# Patient Record
Sex: Female | Born: 1955 | Race: White | Hispanic: No | Smoking: Current every day smoker
Health system: Southern US, Community
[De-identification: ages and names within clinical notes are randomized; demographics above are authoritative.]

## PROBLEM LIST (undated history)

## (undated) DIAGNOSIS — I10 Essential (primary) hypertension: Secondary | ICD-10-CM

## (undated) DIAGNOSIS — G4700 Insomnia, unspecified: Secondary | ICD-10-CM

## (undated) DIAGNOSIS — F32A Depression, unspecified: Secondary | ICD-10-CM

## (undated) DIAGNOSIS — R918 Other nonspecific abnormal finding of lung field: Secondary | ICD-10-CM

## (undated) DIAGNOSIS — N3941 Urge incontinence: Secondary | ICD-10-CM

## (undated) DIAGNOSIS — E7800 Pure hypercholesterolemia, unspecified: Secondary | ICD-10-CM

## (undated) DIAGNOSIS — J849 Interstitial pulmonary disease, unspecified: Secondary | ICD-10-CM

## (undated) DIAGNOSIS — J449 Chronic obstructive pulmonary disease, unspecified: Secondary | ICD-10-CM

## (undated) DIAGNOSIS — J309 Allergic rhinitis, unspecified: Secondary | ICD-10-CM

## (undated) DIAGNOSIS — I7090 Unspecified atherosclerosis: Secondary | ICD-10-CM

## (undated) DIAGNOSIS — Z789 Other specified health status: Secondary | ICD-10-CM

## (undated) DIAGNOSIS — E0500 Thyrotoxicosis with diffuse goiter without thyrotoxic crisis or storm: Secondary | ICD-10-CM

## (undated) DIAGNOSIS — E785 Hyperlipidemia, unspecified: Secondary | ICD-10-CM

## (undated) DIAGNOSIS — K589 Irritable bowel syndrome without diarrhea: Secondary | ICD-10-CM

## (undated) DIAGNOSIS — K629 Disease of anus and rectum, unspecified: Secondary | ICD-10-CM

## (undated) DIAGNOSIS — M791 Myalgia, unspecified site: Secondary | ICD-10-CM

## (undated) HISTORY — DX: Thyrotoxicosis with diffuse goiter without thyrotoxic crisis or storm: E05.00

## (undated) HISTORY — DX: Chronic obstructive pulmonary disease, unspecified: J44.9

## (undated) HISTORY — DX: Myalgia, unspecified site: M79.10

## (undated) HISTORY — DX: Hyperlipidemia, unspecified: E78.5

## (undated) HISTORY — DX: Urge incontinence: N39.41

## (undated) HISTORY — DX: Insomnia, unspecified: G47.00

## (undated) HISTORY — DX: Allergic rhinitis, unspecified: J30.9

## (undated) HISTORY — DX: Depression, unspecified: F32.A

## (undated) HISTORY — DX: Other nonspecific abnormal finding of lung field: R91.8

## (undated) HISTORY — PX: COLONOSCOPY W/ POLYPECTOMY: SHX1380

## (undated) HISTORY — PX: TUBAL LIGATION: SHX77

## (undated) HISTORY — DX: Pure hypercholesterolemia, unspecified: E78.00

## (undated) HISTORY — DX: Other specified health status: Z78.9

## (undated) HISTORY — DX: Interstitial pulmonary disease, unspecified: J84.9

## (undated) HISTORY — DX: Unspecified atherosclerosis: I70.90

## (undated) HISTORY — DX: Essential (primary) hypertension: I10

## (undated) HISTORY — DX: Disease of anus and rectum, unspecified: K62.9

## (undated) HISTORY — PX: VAGINAL HYSTERECTOMY: SUR661

## (undated) MED FILL — Dexamethasone Sodium Phosphate Inj 100 MG/10ML: INTRAMUSCULAR | Qty: 1 | Status: AC

## (undated) MED FILL — Fosaprepitant Dimeglumine For IV Infusion 150 MG (Base Eq): INTRAVENOUS | Qty: 5 | Status: AC

---

## 2002-03-16 ENCOUNTER — Other Ambulatory Visit: Admission: RE | Admit: 2002-03-16 | Discharge: 2002-03-16 | Payer: Self-pay | Admitting: Family Medicine

## 2014-05-17 DIAGNOSIS — J449 Chronic obstructive pulmonary disease, unspecified: Secondary | ICD-10-CM | POA: Diagnosis not present

## 2014-05-28 DIAGNOSIS — J441 Chronic obstructive pulmonary disease with (acute) exacerbation: Secondary | ICD-10-CM | POA: Diagnosis not present

## 2014-06-05 DIAGNOSIS — J441 Chronic obstructive pulmonary disease with (acute) exacerbation: Secondary | ICD-10-CM | POA: Diagnosis not present

## 2014-06-17 DIAGNOSIS — J449 Chronic obstructive pulmonary disease, unspecified: Secondary | ICD-10-CM | POA: Diagnosis not present

## 2014-06-19 DIAGNOSIS — E890 Postprocedural hypothyroidism: Secondary | ICD-10-CM | POA: Diagnosis not present

## 2014-06-19 DIAGNOSIS — R5383 Other fatigue: Secondary | ICD-10-CM | POA: Diagnosis not present

## 2014-06-19 DIAGNOSIS — R05 Cough: Secondary | ICD-10-CM | POA: Diagnosis not present

## 2014-06-19 DIAGNOSIS — J449 Chronic obstructive pulmonary disease, unspecified: Secondary | ICD-10-CM | POA: Diagnosis not present

## 2014-06-19 DIAGNOSIS — F329 Major depressive disorder, single episode, unspecified: Secondary | ICD-10-CM | POA: Diagnosis not present

## 2014-07-16 DIAGNOSIS — J449 Chronic obstructive pulmonary disease, unspecified: Secondary | ICD-10-CM | POA: Diagnosis not present

## 2014-08-16 DIAGNOSIS — J449 Chronic obstructive pulmonary disease, unspecified: Secondary | ICD-10-CM | POA: Diagnosis not present

## 2014-09-09 DIAGNOSIS — F329 Major depressive disorder, single episode, unspecified: Secondary | ICD-10-CM | POA: Diagnosis not present

## 2014-09-09 DIAGNOSIS — R05 Cough: Secondary | ICD-10-CM | POA: Diagnosis not present

## 2014-09-09 DIAGNOSIS — E780 Pure hypercholesterolemia: Secondary | ICD-10-CM | POA: Diagnosis not present

## 2014-09-09 DIAGNOSIS — E890 Postprocedural hypothyroidism: Secondary | ICD-10-CM | POA: Diagnosis not present

## 2014-09-09 DIAGNOSIS — N3941 Urge incontinence: Secondary | ICD-10-CM | POA: Diagnosis not present

## 2014-09-09 DIAGNOSIS — I10 Essential (primary) hypertension: Secondary | ICD-10-CM | POA: Diagnosis not present

## 2014-09-09 DIAGNOSIS — J449 Chronic obstructive pulmonary disease, unspecified: Secondary | ICD-10-CM | POA: Diagnosis not present

## 2014-09-09 DIAGNOSIS — J309 Allergic rhinitis, unspecified: Secondary | ICD-10-CM | POA: Diagnosis not present

## 2014-09-15 DIAGNOSIS — J449 Chronic obstructive pulmonary disease, unspecified: Secondary | ICD-10-CM | POA: Diagnosis not present

## 2014-10-16 DIAGNOSIS — J449 Chronic obstructive pulmonary disease, unspecified: Secondary | ICD-10-CM | POA: Diagnosis not present

## 2014-11-15 DIAGNOSIS — J449 Chronic obstructive pulmonary disease, unspecified: Secondary | ICD-10-CM | POA: Diagnosis not present

## 2014-11-18 DIAGNOSIS — J441 Chronic obstructive pulmonary disease with (acute) exacerbation: Secondary | ICD-10-CM | POA: Diagnosis not present

## 2014-11-18 DIAGNOSIS — Z6825 Body mass index (BMI) 25.0-25.9, adult: Secondary | ICD-10-CM | POA: Diagnosis not present

## 2014-12-16 DIAGNOSIS — J449 Chronic obstructive pulmonary disease, unspecified: Secondary | ICD-10-CM | POA: Diagnosis not present

## 2015-01-16 DIAGNOSIS — J449 Chronic obstructive pulmonary disease, unspecified: Secondary | ICD-10-CM | POA: Diagnosis not present

## 2015-02-15 DIAGNOSIS — J449 Chronic obstructive pulmonary disease, unspecified: Secondary | ICD-10-CM | POA: Diagnosis not present

## 2015-03-12 DIAGNOSIS — J441 Chronic obstructive pulmonary disease with (acute) exacerbation: Secondary | ICD-10-CM | POA: Diagnosis not present

## 2015-03-12 DIAGNOSIS — I10 Essential (primary) hypertension: Secondary | ICD-10-CM | POA: Diagnosis not present

## 2015-03-12 DIAGNOSIS — N3941 Urge incontinence: Secondary | ICD-10-CM | POA: Diagnosis not present

## 2015-03-12 DIAGNOSIS — E890 Postprocedural hypothyroidism: Secondary | ICD-10-CM | POA: Diagnosis not present

## 2015-03-12 DIAGNOSIS — Z1389 Encounter for screening for other disorder: Secondary | ICD-10-CM | POA: Diagnosis not present

## 2015-03-12 DIAGNOSIS — J449 Chronic obstructive pulmonary disease, unspecified: Secondary | ICD-10-CM | POA: Diagnosis not present

## 2015-03-12 DIAGNOSIS — Z23 Encounter for immunization: Secondary | ICD-10-CM | POA: Diagnosis not present

## 2015-03-12 DIAGNOSIS — G4700 Insomnia, unspecified: Secondary | ICD-10-CM | POA: Diagnosis not present

## 2015-03-18 DIAGNOSIS — J449 Chronic obstructive pulmonary disease, unspecified: Secondary | ICD-10-CM | POA: Diagnosis not present

## 2015-04-16 DIAGNOSIS — Z6827 Body mass index (BMI) 27.0-27.9, adult: Secondary | ICD-10-CM | POA: Diagnosis not present

## 2015-04-16 DIAGNOSIS — J441 Chronic obstructive pulmonary disease with (acute) exacerbation: Secondary | ICD-10-CM | POA: Diagnosis not present

## 2015-04-17 DIAGNOSIS — J449 Chronic obstructive pulmonary disease, unspecified: Secondary | ICD-10-CM | POA: Diagnosis not present

## 2015-05-18 DIAGNOSIS — J449 Chronic obstructive pulmonary disease, unspecified: Secondary | ICD-10-CM | POA: Diagnosis not present

## 2015-06-18 DIAGNOSIS — J449 Chronic obstructive pulmonary disease, unspecified: Secondary | ICD-10-CM | POA: Diagnosis not present

## 2015-07-16 DIAGNOSIS — J449 Chronic obstructive pulmonary disease, unspecified: Secondary | ICD-10-CM | POA: Diagnosis not present

## 2015-08-01 DIAGNOSIS — J441 Chronic obstructive pulmonary disease with (acute) exacerbation: Secondary | ICD-10-CM | POA: Diagnosis not present

## 2015-08-01 DIAGNOSIS — Z1389 Encounter for screening for other disorder: Secondary | ICD-10-CM | POA: Diagnosis not present

## 2015-08-01 DIAGNOSIS — Z6827 Body mass index (BMI) 27.0-27.9, adult: Secondary | ICD-10-CM | POA: Diagnosis not present

## 2015-08-16 DIAGNOSIS — J449 Chronic obstructive pulmonary disease, unspecified: Secondary | ICD-10-CM | POA: Diagnosis not present

## 2015-09-10 DIAGNOSIS — N3941 Urge incontinence: Secondary | ICD-10-CM | POA: Diagnosis not present

## 2015-09-10 DIAGNOSIS — J441 Chronic obstructive pulmonary disease with (acute) exacerbation: Secondary | ICD-10-CM | POA: Diagnosis not present

## 2015-09-10 DIAGNOSIS — Z1231 Encounter for screening mammogram for malignant neoplasm of breast: Secondary | ICD-10-CM | POA: Diagnosis not present

## 2015-09-10 DIAGNOSIS — Z0000 Encounter for general adult medical examination without abnormal findings: Secondary | ICD-10-CM | POA: Diagnosis not present

## 2015-09-10 DIAGNOSIS — F329 Major depressive disorder, single episode, unspecified: Secondary | ICD-10-CM | POA: Diagnosis not present

## 2015-09-10 DIAGNOSIS — E890 Postprocedural hypothyroidism: Secondary | ICD-10-CM | POA: Diagnosis not present

## 2015-09-10 DIAGNOSIS — E7800 Pure hypercholesterolemia, unspecified: Secondary | ICD-10-CM | POA: Diagnosis not present

## 2015-09-10 DIAGNOSIS — I10 Essential (primary) hypertension: Secondary | ICD-10-CM | POA: Diagnosis not present

## 2015-09-10 DIAGNOSIS — G4700 Insomnia, unspecified: Secondary | ICD-10-CM | POA: Diagnosis not present

## 2015-09-15 DIAGNOSIS — J449 Chronic obstructive pulmonary disease, unspecified: Secondary | ICD-10-CM | POA: Diagnosis not present

## 2015-09-17 DIAGNOSIS — Z1231 Encounter for screening mammogram for malignant neoplasm of breast: Secondary | ICD-10-CM | POA: Diagnosis not present

## 2015-10-07 DIAGNOSIS — F17200 Nicotine dependence, unspecified, uncomplicated: Secondary | ICD-10-CM | POA: Diagnosis not present

## 2015-10-07 DIAGNOSIS — Z6828 Body mass index (BMI) 28.0-28.9, adult: Secondary | ICD-10-CM | POA: Diagnosis not present

## 2015-10-07 DIAGNOSIS — J441 Chronic obstructive pulmonary disease with (acute) exacerbation: Secondary | ICD-10-CM | POA: Diagnosis not present

## 2015-10-16 DIAGNOSIS — J449 Chronic obstructive pulmonary disease, unspecified: Secondary | ICD-10-CM | POA: Diagnosis not present

## 2015-10-29 DIAGNOSIS — E663 Overweight: Secondary | ICD-10-CM | POA: Diagnosis not present

## 2015-10-29 DIAGNOSIS — Z6828 Body mass index (BMI) 28.0-28.9, adult: Secondary | ICD-10-CM | POA: Diagnosis not present

## 2015-10-29 DIAGNOSIS — J441 Chronic obstructive pulmonary disease with (acute) exacerbation: Secondary | ICD-10-CM | POA: Diagnosis not present

## 2015-11-15 DIAGNOSIS — J449 Chronic obstructive pulmonary disease, unspecified: Secondary | ICD-10-CM | POA: Diagnosis not present

## 2015-12-16 DIAGNOSIS — J449 Chronic obstructive pulmonary disease, unspecified: Secondary | ICD-10-CM | POA: Diagnosis not present

## 2015-12-24 DIAGNOSIS — J441 Chronic obstructive pulmonary disease with (acute) exacerbation: Secondary | ICD-10-CM | POA: Diagnosis not present

## 2015-12-24 DIAGNOSIS — J449 Chronic obstructive pulmonary disease, unspecified: Secondary | ICD-10-CM | POA: Diagnosis not present

## 2015-12-24 DIAGNOSIS — F17200 Nicotine dependence, unspecified, uncomplicated: Secondary | ICD-10-CM | POA: Diagnosis not present

## 2015-12-24 DIAGNOSIS — R0789 Other chest pain: Secondary | ICD-10-CM | POA: Diagnosis not present

## 2015-12-29 DIAGNOSIS — J449 Chronic obstructive pulmonary disease, unspecified: Secondary | ICD-10-CM | POA: Diagnosis not present

## 2016-01-16 DIAGNOSIS — J449 Chronic obstructive pulmonary disease, unspecified: Secondary | ICD-10-CM | POA: Diagnosis not present

## 2016-01-26 DIAGNOSIS — R0902 Hypoxemia: Secondary | ICD-10-CM | POA: Diagnosis not present

## 2016-01-26 DIAGNOSIS — R05 Cough: Secondary | ICD-10-CM | POA: Diagnosis not present

## 2016-01-26 DIAGNOSIS — R0602 Shortness of breath: Secondary | ICD-10-CM | POA: Diagnosis not present

## 2016-01-26 DIAGNOSIS — J449 Chronic obstructive pulmonary disease, unspecified: Secondary | ICD-10-CM | POA: Diagnosis not present

## 2016-02-02 DIAGNOSIS — J449 Chronic obstructive pulmonary disease, unspecified: Secondary | ICD-10-CM | POA: Diagnosis not present

## 2016-02-09 DIAGNOSIS — J449 Chronic obstructive pulmonary disease, unspecified: Secondary | ICD-10-CM | POA: Diagnosis not present

## 2016-02-09 DIAGNOSIS — R938 Abnormal findings on diagnostic imaging of other specified body structures: Secondary | ICD-10-CM | POA: Diagnosis not present

## 2016-02-09 DIAGNOSIS — R0902 Hypoxemia: Secondary | ICD-10-CM | POA: Diagnosis not present

## 2016-02-09 DIAGNOSIS — R0602 Shortness of breath: Secondary | ICD-10-CM | POA: Diagnosis not present

## 2016-02-15 DIAGNOSIS — J449 Chronic obstructive pulmonary disease, unspecified: Secondary | ICD-10-CM | POA: Diagnosis not present

## 2016-03-04 DIAGNOSIS — J449 Chronic obstructive pulmonary disease, unspecified: Secondary | ICD-10-CM | POA: Diagnosis not present

## 2016-03-15 DIAGNOSIS — Z79899 Other long term (current) drug therapy: Secondary | ICD-10-CM | POA: Diagnosis not present

## 2016-03-15 DIAGNOSIS — N3941 Urge incontinence: Secondary | ICD-10-CM | POA: Diagnosis not present

## 2016-03-15 DIAGNOSIS — J449 Chronic obstructive pulmonary disease, unspecified: Secondary | ICD-10-CM | POA: Diagnosis not present

## 2016-03-15 DIAGNOSIS — G4700 Insomnia, unspecified: Secondary | ICD-10-CM | POA: Diagnosis not present

## 2016-03-15 DIAGNOSIS — F329 Major depressive disorder, single episode, unspecified: Secondary | ICD-10-CM | POA: Diagnosis not present

## 2016-03-15 DIAGNOSIS — E890 Postprocedural hypothyroidism: Secondary | ICD-10-CM | POA: Diagnosis not present

## 2016-03-15 DIAGNOSIS — Z6828 Body mass index (BMI) 28.0-28.9, adult: Secondary | ICD-10-CM | POA: Diagnosis not present

## 2016-03-15 DIAGNOSIS — K219 Gastro-esophageal reflux disease without esophagitis: Secondary | ICD-10-CM | POA: Diagnosis not present

## 2016-03-15 DIAGNOSIS — Z23 Encounter for immunization: Secondary | ICD-10-CM | POA: Diagnosis not present

## 2016-03-17 DIAGNOSIS — J449 Chronic obstructive pulmonary disease, unspecified: Secondary | ICD-10-CM | POA: Diagnosis not present

## 2016-04-01 DIAGNOSIS — J449 Chronic obstructive pulmonary disease, unspecified: Secondary | ICD-10-CM | POA: Diagnosis not present

## 2016-04-01 DIAGNOSIS — R05 Cough: Secondary | ICD-10-CM | POA: Diagnosis not present

## 2016-04-01 DIAGNOSIS — R0609 Other forms of dyspnea: Secondary | ICD-10-CM | POA: Diagnosis not present

## 2016-04-03 DIAGNOSIS — J449 Chronic obstructive pulmonary disease, unspecified: Secondary | ICD-10-CM | POA: Diagnosis not present

## 2016-04-16 DIAGNOSIS — J449 Chronic obstructive pulmonary disease, unspecified: Secondary | ICD-10-CM | POA: Diagnosis not present

## 2016-05-04 DIAGNOSIS — J449 Chronic obstructive pulmonary disease, unspecified: Secondary | ICD-10-CM | POA: Diagnosis not present

## 2016-05-17 DIAGNOSIS — J449 Chronic obstructive pulmonary disease, unspecified: Secondary | ICD-10-CM | POA: Diagnosis not present

## 2016-06-04 DIAGNOSIS — J449 Chronic obstructive pulmonary disease, unspecified: Secondary | ICD-10-CM | POA: Diagnosis not present

## 2016-06-17 DIAGNOSIS — J449 Chronic obstructive pulmonary disease, unspecified: Secondary | ICD-10-CM | POA: Diagnosis not present

## 2016-07-02 DIAGNOSIS — J449 Chronic obstructive pulmonary disease, unspecified: Secondary | ICD-10-CM | POA: Diagnosis not present

## 2016-07-05 DIAGNOSIS — H6120 Impacted cerumen, unspecified ear: Secondary | ICD-10-CM | POA: Diagnosis not present

## 2016-07-05 DIAGNOSIS — J441 Chronic obstructive pulmonary disease with (acute) exacerbation: Secondary | ICD-10-CM | POA: Diagnosis not present

## 2016-07-15 DIAGNOSIS — J449 Chronic obstructive pulmonary disease, unspecified: Secondary | ICD-10-CM | POA: Diagnosis not present

## 2016-07-27 DIAGNOSIS — J441 Chronic obstructive pulmonary disease with (acute) exacerbation: Secondary | ICD-10-CM | POA: Diagnosis not present

## 2016-07-27 DIAGNOSIS — R05 Cough: Secondary | ICD-10-CM | POA: Diagnosis not present

## 2016-07-27 DIAGNOSIS — R6883 Chills (without fever): Secondary | ICD-10-CM | POA: Diagnosis not present

## 2016-07-27 DIAGNOSIS — J449 Chronic obstructive pulmonary disease, unspecified: Secondary | ICD-10-CM | POA: Diagnosis not present

## 2016-07-27 DIAGNOSIS — R0609 Other forms of dyspnea: Secondary | ICD-10-CM | POA: Diagnosis not present

## 2016-07-27 DIAGNOSIS — R0602 Shortness of breath: Secondary | ICD-10-CM | POA: Diagnosis not present

## 2016-08-02 DIAGNOSIS — J449 Chronic obstructive pulmonary disease, unspecified: Secondary | ICD-10-CM | POA: Diagnosis not present

## 2016-08-12 DIAGNOSIS — J449 Chronic obstructive pulmonary disease, unspecified: Secondary | ICD-10-CM | POA: Diagnosis not present

## 2016-08-15 DIAGNOSIS — J449 Chronic obstructive pulmonary disease, unspecified: Secondary | ICD-10-CM | POA: Diagnosis not present

## 2016-09-01 DIAGNOSIS — J449 Chronic obstructive pulmonary disease, unspecified: Secondary | ICD-10-CM | POA: Diagnosis not present

## 2016-09-13 DIAGNOSIS — M791 Myalgia: Secondary | ICD-10-CM | POA: Diagnosis not present

## 2016-09-13 DIAGNOSIS — Z789 Other specified health status: Secondary | ICD-10-CM | POA: Diagnosis not present

## 2016-09-13 DIAGNOSIS — I10 Essential (primary) hypertension: Secondary | ICD-10-CM | POA: Diagnosis not present

## 2016-09-13 DIAGNOSIS — E7800 Pure hypercholesterolemia, unspecified: Secondary | ICD-10-CM | POA: Diagnosis not present

## 2016-09-13 DIAGNOSIS — J449 Chronic obstructive pulmonary disease, unspecified: Secondary | ICD-10-CM | POA: Diagnosis not present

## 2016-09-13 DIAGNOSIS — Z79899 Other long term (current) drug therapy: Secondary | ICD-10-CM | POA: Diagnosis not present

## 2016-09-13 DIAGNOSIS — J309 Allergic rhinitis, unspecified: Secondary | ICD-10-CM | POA: Diagnosis not present

## 2016-09-13 DIAGNOSIS — E890 Postprocedural hypothyroidism: Secondary | ICD-10-CM | POA: Diagnosis not present

## 2016-09-13 DIAGNOSIS — F329 Major depressive disorder, single episode, unspecified: Secondary | ICD-10-CM | POA: Diagnosis not present

## 2016-09-14 DIAGNOSIS — J449 Chronic obstructive pulmonary disease, unspecified: Secondary | ICD-10-CM | POA: Diagnosis not present

## 2016-09-30 DIAGNOSIS — J449 Chronic obstructive pulmonary disease, unspecified: Secondary | ICD-10-CM | POA: Diagnosis not present

## 2016-09-30 DIAGNOSIS — R0609 Other forms of dyspnea: Secondary | ICD-10-CM | POA: Diagnosis not present

## 2016-09-30 DIAGNOSIS — R05 Cough: Secondary | ICD-10-CM | POA: Diagnosis not present

## 2016-10-02 DIAGNOSIS — J449 Chronic obstructive pulmonary disease, unspecified: Secondary | ICD-10-CM | POA: Diagnosis not present

## 2016-10-15 DIAGNOSIS — J449 Chronic obstructive pulmonary disease, unspecified: Secondary | ICD-10-CM | POA: Diagnosis not present

## 2016-11-01 DIAGNOSIS — J449 Chronic obstructive pulmonary disease, unspecified: Secondary | ICD-10-CM | POA: Diagnosis not present

## 2016-11-14 DIAGNOSIS — J449 Chronic obstructive pulmonary disease, unspecified: Secondary | ICD-10-CM | POA: Diagnosis not present

## 2016-11-29 DIAGNOSIS — Z6831 Body mass index (BMI) 31.0-31.9, adult: Secondary | ICD-10-CM | POA: Diagnosis not present

## 2016-11-29 DIAGNOSIS — R232 Flushing: Secondary | ICD-10-CM | POA: Diagnosis not present

## 2016-11-29 DIAGNOSIS — E669 Obesity, unspecified: Secondary | ICD-10-CM | POA: Diagnosis not present

## 2016-12-02 DIAGNOSIS — J449 Chronic obstructive pulmonary disease, unspecified: Secondary | ICD-10-CM | POA: Diagnosis not present

## 2016-12-15 DIAGNOSIS — J449 Chronic obstructive pulmonary disease, unspecified: Secondary | ICD-10-CM | POA: Diagnosis not present

## 2016-12-30 DIAGNOSIS — R0609 Other forms of dyspnea: Secondary | ICD-10-CM | POA: Diagnosis not present

## 2016-12-30 DIAGNOSIS — R0902 Hypoxemia: Secondary | ICD-10-CM | POA: Diagnosis not present

## 2016-12-30 DIAGNOSIS — R05 Cough: Secondary | ICD-10-CM | POA: Diagnosis not present

## 2016-12-30 DIAGNOSIS — J449 Chronic obstructive pulmonary disease, unspecified: Secondary | ICD-10-CM | POA: Diagnosis not present

## 2017-01-02 DIAGNOSIS — J449 Chronic obstructive pulmonary disease, unspecified: Secondary | ICD-10-CM | POA: Diagnosis not present

## 2017-01-15 DIAGNOSIS — J449 Chronic obstructive pulmonary disease, unspecified: Secondary | ICD-10-CM | POA: Diagnosis not present

## 2017-02-01 DIAGNOSIS — J449 Chronic obstructive pulmonary disease, unspecified: Secondary | ICD-10-CM | POA: Diagnosis not present

## 2017-02-14 DIAGNOSIS — J449 Chronic obstructive pulmonary disease, unspecified: Secondary | ICD-10-CM | POA: Diagnosis not present

## 2017-03-04 DIAGNOSIS — J449 Chronic obstructive pulmonary disease, unspecified: Secondary | ICD-10-CM | POA: Diagnosis not present

## 2017-03-10 DIAGNOSIS — R0609 Other forms of dyspnea: Secondary | ICD-10-CM | POA: Diagnosis not present

## 2017-03-10 DIAGNOSIS — J310 Chronic rhinitis: Secondary | ICD-10-CM | POA: Diagnosis not present

## 2017-03-10 DIAGNOSIS — J449 Chronic obstructive pulmonary disease, unspecified: Secondary | ICD-10-CM | POA: Diagnosis not present

## 2017-03-10 DIAGNOSIS — R05 Cough: Secondary | ICD-10-CM | POA: Diagnosis not present

## 2017-03-10 DIAGNOSIS — Z23 Encounter for immunization: Secondary | ICD-10-CM | POA: Diagnosis not present

## 2017-03-16 DIAGNOSIS — F329 Major depressive disorder, single episode, unspecified: Secondary | ICD-10-CM | POA: Diagnosis not present

## 2017-03-16 DIAGNOSIS — I10 Essential (primary) hypertension: Secondary | ICD-10-CM | POA: Diagnosis not present

## 2017-03-16 DIAGNOSIS — K589 Irritable bowel syndrome without diarrhea: Secondary | ICD-10-CM | POA: Diagnosis not present

## 2017-03-16 DIAGNOSIS — E890 Postprocedural hypothyroidism: Secondary | ICD-10-CM | POA: Diagnosis not present

## 2017-03-16 DIAGNOSIS — R5383 Other fatigue: Secondary | ICD-10-CM | POA: Diagnosis not present

## 2017-03-16 DIAGNOSIS — G4700 Insomnia, unspecified: Secondary | ICD-10-CM | POA: Diagnosis not present

## 2017-03-16 DIAGNOSIS — Z79899 Other long term (current) drug therapy: Secondary | ICD-10-CM | POA: Diagnosis not present

## 2017-03-16 DIAGNOSIS — J449 Chronic obstructive pulmonary disease, unspecified: Secondary | ICD-10-CM | POA: Diagnosis not present

## 2017-03-16 DIAGNOSIS — M791 Myalgia, unspecified site: Secondary | ICD-10-CM | POA: Diagnosis not present

## 2017-03-17 DIAGNOSIS — J449 Chronic obstructive pulmonary disease, unspecified: Secondary | ICD-10-CM | POA: Diagnosis not present

## 2017-03-30 DIAGNOSIS — R0602 Shortness of breath: Secondary | ICD-10-CM | POA: Diagnosis not present

## 2017-03-30 DIAGNOSIS — G4733 Obstructive sleep apnea (adult) (pediatric): Secondary | ICD-10-CM | POA: Diagnosis not present

## 2017-03-31 DIAGNOSIS — Z6831 Body mass index (BMI) 31.0-31.9, adult: Secondary | ICD-10-CM | POA: Diagnosis not present

## 2017-03-31 DIAGNOSIS — Z1331 Encounter for screening for depression: Secondary | ICD-10-CM | POA: Diagnosis not present

## 2017-03-31 DIAGNOSIS — R0602 Shortness of breath: Secondary | ICD-10-CM | POA: Diagnosis not present

## 2017-03-31 DIAGNOSIS — E785 Hyperlipidemia, unspecified: Secondary | ICD-10-CM | POA: Diagnosis not present

## 2017-03-31 DIAGNOSIS — Z136 Encounter for screening for cardiovascular disorders: Secondary | ICD-10-CM | POA: Diagnosis not present

## 2017-03-31 DIAGNOSIS — Z0000 Encounter for general adult medical examination without abnormal findings: Secondary | ICD-10-CM | POA: Diagnosis not present

## 2017-03-31 DIAGNOSIS — G4733 Obstructive sleep apnea (adult) (pediatric): Secondary | ICD-10-CM | POA: Diagnosis not present

## 2017-03-31 DIAGNOSIS — Z9181 History of falling: Secondary | ICD-10-CM | POA: Diagnosis not present

## 2017-03-31 DIAGNOSIS — E669 Obesity, unspecified: Secondary | ICD-10-CM | POA: Diagnosis not present

## 2017-04-03 DIAGNOSIS — J449 Chronic obstructive pulmonary disease, unspecified: Secondary | ICD-10-CM | POA: Diagnosis not present

## 2017-04-16 DIAGNOSIS — J449 Chronic obstructive pulmonary disease, unspecified: Secondary | ICD-10-CM | POA: Diagnosis not present

## 2017-05-04 DIAGNOSIS — J449 Chronic obstructive pulmonary disease, unspecified: Secondary | ICD-10-CM | POA: Diagnosis not present

## 2017-05-05 DIAGNOSIS — Z6832 Body mass index (BMI) 32.0-32.9, adult: Secondary | ICD-10-CM | POA: Diagnosis not present

## 2017-05-05 DIAGNOSIS — J441 Chronic obstructive pulmonary disease with (acute) exacerbation: Secondary | ICD-10-CM | POA: Diagnosis not present

## 2017-05-13 DIAGNOSIS — J441 Chronic obstructive pulmonary disease with (acute) exacerbation: Secondary | ICD-10-CM | POA: Diagnosis not present

## 2017-05-17 DIAGNOSIS — J449 Chronic obstructive pulmonary disease, unspecified: Secondary | ICD-10-CM | POA: Diagnosis not present

## 2017-06-02 DIAGNOSIS — F17200 Nicotine dependence, unspecified, uncomplicated: Secondary | ICD-10-CM | POA: Diagnosis not present

## 2017-06-02 DIAGNOSIS — Z6831 Body mass index (BMI) 31.0-31.9, adult: Secondary | ICD-10-CM | POA: Diagnosis not present

## 2017-06-02 DIAGNOSIS — J449 Chronic obstructive pulmonary disease, unspecified: Secondary | ICD-10-CM | POA: Diagnosis not present

## 2017-06-04 DIAGNOSIS — J449 Chronic obstructive pulmonary disease, unspecified: Secondary | ICD-10-CM | POA: Diagnosis not present

## 2017-06-17 DIAGNOSIS — J449 Chronic obstructive pulmonary disease, unspecified: Secondary | ICD-10-CM | POA: Diagnosis not present

## 2017-07-01 DIAGNOSIS — J449 Chronic obstructive pulmonary disease, unspecified: Secondary | ICD-10-CM | POA: Diagnosis not present

## 2017-07-02 DIAGNOSIS — J449 Chronic obstructive pulmonary disease, unspecified: Secondary | ICD-10-CM | POA: Diagnosis not present

## 2017-07-14 DIAGNOSIS — R05 Cough: Secondary | ICD-10-CM | POA: Diagnosis not present

## 2017-07-14 DIAGNOSIS — J449 Chronic obstructive pulmonary disease, unspecified: Secondary | ICD-10-CM | POA: Diagnosis not present

## 2017-07-14 DIAGNOSIS — Z9981 Dependence on supplemental oxygen: Secondary | ICD-10-CM | POA: Diagnosis not present

## 2017-07-14 DIAGNOSIS — R0602 Shortness of breath: Secondary | ICD-10-CM | POA: Diagnosis not present

## 2017-07-15 DIAGNOSIS — J449 Chronic obstructive pulmonary disease, unspecified: Secondary | ICD-10-CM | POA: Diagnosis not present

## 2017-08-02 DIAGNOSIS — J449 Chronic obstructive pulmonary disease, unspecified: Secondary | ICD-10-CM | POA: Diagnosis not present

## 2017-08-15 DIAGNOSIS — J449 Chronic obstructive pulmonary disease, unspecified: Secondary | ICD-10-CM | POA: Diagnosis not present

## 2017-09-01 DIAGNOSIS — J449 Chronic obstructive pulmonary disease, unspecified: Secondary | ICD-10-CM | POA: Diagnosis not present

## 2017-09-13 DIAGNOSIS — J449 Chronic obstructive pulmonary disease, unspecified: Secondary | ICD-10-CM | POA: Diagnosis not present

## 2017-09-13 DIAGNOSIS — E7800 Pure hypercholesterolemia, unspecified: Secondary | ICD-10-CM | POA: Diagnosis not present

## 2017-09-13 DIAGNOSIS — K219 Gastro-esophageal reflux disease without esophagitis: Secondary | ICD-10-CM | POA: Diagnosis not present

## 2017-09-13 DIAGNOSIS — Z79899 Other long term (current) drug therapy: Secondary | ICD-10-CM | POA: Diagnosis not present

## 2017-09-13 DIAGNOSIS — E890 Postprocedural hypothyroidism: Secondary | ICD-10-CM | POA: Diagnosis not present

## 2017-09-13 DIAGNOSIS — G4700 Insomnia, unspecified: Secondary | ICD-10-CM | POA: Diagnosis not present

## 2017-09-13 DIAGNOSIS — I10 Essential (primary) hypertension: Secondary | ICD-10-CM | POA: Diagnosis not present

## 2017-09-13 DIAGNOSIS — F329 Major depressive disorder, single episode, unspecified: Secondary | ICD-10-CM | POA: Diagnosis not present

## 2017-09-13 DIAGNOSIS — Z6830 Body mass index (BMI) 30.0-30.9, adult: Secondary | ICD-10-CM | POA: Diagnosis not present

## 2017-09-14 DIAGNOSIS — J449 Chronic obstructive pulmonary disease, unspecified: Secondary | ICD-10-CM | POA: Diagnosis not present

## 2017-09-15 DIAGNOSIS — Z9981 Dependence on supplemental oxygen: Secondary | ICD-10-CM | POA: Diagnosis not present

## 2017-09-15 DIAGNOSIS — J449 Chronic obstructive pulmonary disease, unspecified: Secondary | ICD-10-CM | POA: Diagnosis not present

## 2017-09-15 DIAGNOSIS — R0609 Other forms of dyspnea: Secondary | ICD-10-CM | POA: Diagnosis not present

## 2017-10-02 DIAGNOSIS — J449 Chronic obstructive pulmonary disease, unspecified: Secondary | ICD-10-CM | POA: Diagnosis not present

## 2017-10-15 DIAGNOSIS — J449 Chronic obstructive pulmonary disease, unspecified: Secondary | ICD-10-CM | POA: Diagnosis not present

## 2017-11-01 DIAGNOSIS — J449 Chronic obstructive pulmonary disease, unspecified: Secondary | ICD-10-CM | POA: Diagnosis not present

## 2017-11-14 DIAGNOSIS — J449 Chronic obstructive pulmonary disease, unspecified: Secondary | ICD-10-CM | POA: Diagnosis not present

## 2017-12-02 DIAGNOSIS — J449 Chronic obstructive pulmonary disease, unspecified: Secondary | ICD-10-CM | POA: Diagnosis not present

## 2017-12-15 DIAGNOSIS — J449 Chronic obstructive pulmonary disease, unspecified: Secondary | ICD-10-CM | POA: Diagnosis not present

## 2017-12-29 DIAGNOSIS — J449 Chronic obstructive pulmonary disease, unspecified: Secondary | ICD-10-CM | POA: Diagnosis not present

## 2017-12-29 DIAGNOSIS — Z9981 Dependence on supplemental oxygen: Secondary | ICD-10-CM | POA: Diagnosis not present

## 2017-12-29 DIAGNOSIS — J439 Emphysema, unspecified: Secondary | ICD-10-CM | POA: Diagnosis not present

## 2017-12-29 DIAGNOSIS — R0609 Other forms of dyspnea: Secondary | ICD-10-CM | POA: Diagnosis not present

## 2018-01-02 DIAGNOSIS — J449 Chronic obstructive pulmonary disease, unspecified: Secondary | ICD-10-CM | POA: Diagnosis not present

## 2018-01-15 DIAGNOSIS — J449 Chronic obstructive pulmonary disease, unspecified: Secondary | ICD-10-CM | POA: Diagnosis not present

## 2018-02-01 DIAGNOSIS — J449 Chronic obstructive pulmonary disease, unspecified: Secondary | ICD-10-CM | POA: Diagnosis not present

## 2018-02-14 DIAGNOSIS — J449 Chronic obstructive pulmonary disease, unspecified: Secondary | ICD-10-CM | POA: Diagnosis not present

## 2018-03-04 DIAGNOSIS — J449 Chronic obstructive pulmonary disease, unspecified: Secondary | ICD-10-CM | POA: Diagnosis not present

## 2018-03-16 DIAGNOSIS — Z23 Encounter for immunization: Secondary | ICD-10-CM | POA: Diagnosis not present

## 2018-03-16 DIAGNOSIS — E890 Postprocedural hypothyroidism: Secondary | ICD-10-CM | POA: Diagnosis not present

## 2018-03-16 DIAGNOSIS — I10 Essential (primary) hypertension: Secondary | ICD-10-CM | POA: Diagnosis not present

## 2018-03-16 DIAGNOSIS — J449 Chronic obstructive pulmonary disease, unspecified: Secondary | ICD-10-CM | POA: Diagnosis not present

## 2018-03-16 DIAGNOSIS — Z1339 Encounter for screening examination for other mental health and behavioral disorders: Secondary | ICD-10-CM | POA: Diagnosis not present

## 2018-03-16 DIAGNOSIS — Z1331 Encounter for screening for depression: Secondary | ICD-10-CM | POA: Diagnosis not present

## 2018-03-16 DIAGNOSIS — Z79899 Other long term (current) drug therapy: Secondary | ICD-10-CM | POA: Diagnosis not present

## 2018-03-16 DIAGNOSIS — F329 Major depressive disorder, single episode, unspecified: Secondary | ICD-10-CM | POA: Diagnosis not present

## 2018-03-16 DIAGNOSIS — E7800 Pure hypercholesterolemia, unspecified: Secondary | ICD-10-CM | POA: Diagnosis not present

## 2018-03-17 DIAGNOSIS — J449 Chronic obstructive pulmonary disease, unspecified: Secondary | ICD-10-CM | POA: Diagnosis not present

## 2018-04-03 DIAGNOSIS — N959 Unspecified menopausal and perimenopausal disorder: Secondary | ICD-10-CM | POA: Diagnosis not present

## 2018-04-03 DIAGNOSIS — Z136 Encounter for screening for cardiovascular disorders: Secondary | ICD-10-CM | POA: Diagnosis not present

## 2018-04-03 DIAGNOSIS — Z1239 Encounter for other screening for malignant neoplasm of breast: Secondary | ICD-10-CM | POA: Diagnosis not present

## 2018-04-03 DIAGNOSIS — J449 Chronic obstructive pulmonary disease, unspecified: Secondary | ICD-10-CM | POA: Diagnosis not present

## 2018-04-03 DIAGNOSIS — E785 Hyperlipidemia, unspecified: Secondary | ICD-10-CM | POA: Diagnosis not present

## 2018-04-03 DIAGNOSIS — Z0000 Encounter for general adult medical examination without abnormal findings: Secondary | ICD-10-CM | POA: Diagnosis not present

## 2018-04-16 DIAGNOSIS — J449 Chronic obstructive pulmonary disease, unspecified: Secondary | ICD-10-CM | POA: Diagnosis not present

## 2018-05-01 DIAGNOSIS — R05 Cough: Secondary | ICD-10-CM | POA: Diagnosis not present

## 2018-05-01 DIAGNOSIS — R0609 Other forms of dyspnea: Secondary | ICD-10-CM | POA: Diagnosis not present

## 2018-05-01 DIAGNOSIS — J449 Chronic obstructive pulmonary disease, unspecified: Secondary | ICD-10-CM | POA: Diagnosis not present

## 2018-05-04 DIAGNOSIS — J449 Chronic obstructive pulmonary disease, unspecified: Secondary | ICD-10-CM | POA: Diagnosis not present

## 2018-05-17 DIAGNOSIS — J449 Chronic obstructive pulmonary disease, unspecified: Secondary | ICD-10-CM | POA: Diagnosis not present

## 2018-06-03 DIAGNOSIS — J449 Chronic obstructive pulmonary disease, unspecified: Secondary | ICD-10-CM | POA: Diagnosis not present

## 2018-06-04 DIAGNOSIS — J449 Chronic obstructive pulmonary disease, unspecified: Secondary | ICD-10-CM | POA: Diagnosis not present

## 2018-06-17 DIAGNOSIS — J449 Chronic obstructive pulmonary disease, unspecified: Secondary | ICD-10-CM | POA: Diagnosis not present

## 2018-06-29 DIAGNOSIS — J449 Chronic obstructive pulmonary disease, unspecified: Secondary | ICD-10-CM | POA: Diagnosis not present

## 2018-06-29 DIAGNOSIS — J849 Interstitial pulmonary disease, unspecified: Secondary | ICD-10-CM | POA: Diagnosis not present

## 2018-06-30 ENCOUNTER — Other Ambulatory Visit: Payer: Self-pay | Admitting: Specialist

## 2018-06-30 DIAGNOSIS — J849 Interstitial pulmonary disease, unspecified: Secondary | ICD-10-CM

## 2018-07-03 DIAGNOSIS — J449 Chronic obstructive pulmonary disease, unspecified: Secondary | ICD-10-CM | POA: Diagnosis not present

## 2018-07-16 DIAGNOSIS — J449 Chronic obstructive pulmonary disease, unspecified: Secondary | ICD-10-CM | POA: Diagnosis not present

## 2018-07-20 ENCOUNTER — Ambulatory Visit: Payer: Medicare HMO

## 2018-08-03 DIAGNOSIS — J449 Chronic obstructive pulmonary disease, unspecified: Secondary | ICD-10-CM | POA: Diagnosis not present

## 2018-08-16 DIAGNOSIS — J449 Chronic obstructive pulmonary disease, unspecified: Secondary | ICD-10-CM | POA: Diagnosis not present

## 2018-08-17 DIAGNOSIS — J449 Chronic obstructive pulmonary disease, unspecified: Secondary | ICD-10-CM | POA: Diagnosis not present

## 2018-09-02 DIAGNOSIS — J449 Chronic obstructive pulmonary disease, unspecified: Secondary | ICD-10-CM | POA: Diagnosis not present

## 2018-09-05 ENCOUNTER — Other Ambulatory Visit: Payer: Self-pay

## 2018-09-05 ENCOUNTER — Ambulatory Visit
Admission: RE | Admit: 2018-09-05 | Discharge: 2018-09-05 | Disposition: A | Discharge status: Home / Self Care | Payer: Medicare HMO | Source: Ambulatory Visit | Attending: Specialist | Admitting: Specialist

## 2018-09-05 ENCOUNTER — Ambulatory Visit: Payer: Medicare HMO

## 2018-09-05 DIAGNOSIS — J9809 Other diseases of bronchus, not elsewhere classified: Secondary | ICD-10-CM | POA: Diagnosis not present

## 2018-09-05 DIAGNOSIS — J849 Interstitial pulmonary disease, unspecified: Secondary | ICD-10-CM | POA: Diagnosis not present

## 2018-09-05 DIAGNOSIS — J439 Emphysema, unspecified: Secondary | ICD-10-CM | POA: Diagnosis not present

## 2018-09-14 DIAGNOSIS — G4700 Insomnia, unspecified: Secondary | ICD-10-CM | POA: Diagnosis not present

## 2018-09-14 DIAGNOSIS — K219 Gastro-esophageal reflux disease without esophagitis: Secondary | ICD-10-CM | POA: Diagnosis not present

## 2018-09-14 DIAGNOSIS — E7800 Pure hypercholesterolemia, unspecified: Secondary | ICD-10-CM | POA: Diagnosis not present

## 2018-09-14 DIAGNOSIS — J449 Chronic obstructive pulmonary disease, unspecified: Secondary | ICD-10-CM | POA: Diagnosis not present

## 2018-09-14 DIAGNOSIS — E890 Postprocedural hypothyroidism: Secondary | ICD-10-CM | POA: Diagnosis not present

## 2018-09-14 DIAGNOSIS — R251 Tremor, unspecified: Secondary | ICD-10-CM | POA: Diagnosis not present

## 2018-09-14 DIAGNOSIS — F329 Major depressive disorder, single episode, unspecified: Secondary | ICD-10-CM | POA: Diagnosis not present

## 2018-09-14 DIAGNOSIS — I10 Essential (primary) hypertension: Secondary | ICD-10-CM | POA: Diagnosis not present

## 2018-09-27 DIAGNOSIS — R0600 Dyspnea, unspecified: Secondary | ICD-10-CM | POA: Diagnosis not present

## 2018-09-27 DIAGNOSIS — R05 Cough: Secondary | ICD-10-CM | POA: Diagnosis not present

## 2018-09-27 DIAGNOSIS — J449 Chronic obstructive pulmonary disease, unspecified: Secondary | ICD-10-CM | POA: Diagnosis not present

## 2018-09-27 DIAGNOSIS — G4733 Obstructive sleep apnea (adult) (pediatric): Secondary | ICD-10-CM | POA: Diagnosis not present

## 2018-10-03 DIAGNOSIS — J449 Chronic obstructive pulmonary disease, unspecified: Secondary | ICD-10-CM | POA: Diagnosis not present

## 2018-10-19 DIAGNOSIS — Z6828 Body mass index (BMI) 28.0-28.9, adult: Secondary | ICD-10-CM | POA: Diagnosis not present

## 2018-10-19 DIAGNOSIS — E890 Postprocedural hypothyroidism: Secondary | ICD-10-CM | POA: Diagnosis not present

## 2018-10-19 DIAGNOSIS — F329 Major depressive disorder, single episode, unspecified: Secondary | ICD-10-CM | POA: Diagnosis not present

## 2018-10-19 DIAGNOSIS — K219 Gastro-esophageal reflux disease without esophagitis: Secondary | ICD-10-CM | POA: Diagnosis not present

## 2018-10-19 DIAGNOSIS — I10 Essential (primary) hypertension: Secondary | ICD-10-CM | POA: Diagnosis not present

## 2018-10-19 DIAGNOSIS — Z79899 Other long term (current) drug therapy: Secondary | ICD-10-CM | POA: Diagnosis not present

## 2018-10-19 DIAGNOSIS — J449 Chronic obstructive pulmonary disease, unspecified: Secondary | ICD-10-CM | POA: Diagnosis not present

## 2018-10-19 DIAGNOSIS — R251 Tremor, unspecified: Secondary | ICD-10-CM | POA: Diagnosis not present

## 2018-10-19 DIAGNOSIS — E7800 Pure hypercholesterolemia, unspecified: Secondary | ICD-10-CM | POA: Diagnosis not present

## 2018-11-02 DIAGNOSIS — J449 Chronic obstructive pulmonary disease, unspecified: Secondary | ICD-10-CM | POA: Diagnosis not present

## 2018-12-03 DIAGNOSIS — J449 Chronic obstructive pulmonary disease, unspecified: Secondary | ICD-10-CM | POA: Diagnosis not present

## 2018-12-04 DIAGNOSIS — J449 Chronic obstructive pulmonary disease, unspecified: Secondary | ICD-10-CM | POA: Diagnosis not present

## 2018-12-28 DIAGNOSIS — J449 Chronic obstructive pulmonary disease, unspecified: Secondary | ICD-10-CM | POA: Diagnosis not present

## 2018-12-28 DIAGNOSIS — Z9981 Dependence on supplemental oxygen: Secondary | ICD-10-CM | POA: Diagnosis not present

## 2018-12-28 DIAGNOSIS — R0600 Dyspnea, unspecified: Secondary | ICD-10-CM | POA: Diagnosis not present

## 2018-12-28 DIAGNOSIS — J310 Chronic rhinitis: Secondary | ICD-10-CM | POA: Diagnosis not present

## 2019-01-03 DIAGNOSIS — J449 Chronic obstructive pulmonary disease, unspecified: Secondary | ICD-10-CM | POA: Diagnosis not present

## 2019-04-09 DIAGNOSIS — Z1331 Encounter for screening for depression: Secondary | ICD-10-CM | POA: Diagnosis not present

## 2019-04-09 DIAGNOSIS — Z0000 Encounter for general adult medical examination without abnormal findings: Secondary | ICD-10-CM | POA: Diagnosis not present

## 2019-04-09 DIAGNOSIS — E785 Hyperlipidemia, unspecified: Secondary | ICD-10-CM | POA: Diagnosis not present

## 2019-04-09 DIAGNOSIS — Z1231 Encounter for screening mammogram for malignant neoplasm of breast: Secondary | ICD-10-CM | POA: Diagnosis not present

## 2019-04-09 DIAGNOSIS — Z9181 History of falling: Secondary | ICD-10-CM | POA: Diagnosis not present

## 2019-04-09 DIAGNOSIS — N959 Unspecified menopausal and perimenopausal disorder: Secondary | ICD-10-CM | POA: Diagnosis not present

## 2019-04-16 DIAGNOSIS — Z23 Encounter for immunization: Secondary | ICD-10-CM | POA: Diagnosis not present

## 2019-04-16 DIAGNOSIS — J310 Chronic rhinitis: Secondary | ICD-10-CM | POA: Diagnosis not present

## 2019-04-16 DIAGNOSIS — J449 Chronic obstructive pulmonary disease, unspecified: Secondary | ICD-10-CM | POA: Diagnosis not present

## 2019-04-16 DIAGNOSIS — R0600 Dyspnea, unspecified: Secondary | ICD-10-CM | POA: Diagnosis not present

## 2019-04-16 DIAGNOSIS — R05 Cough: Secondary | ICD-10-CM | POA: Diagnosis not present

## 2019-04-16 DIAGNOSIS — Z9981 Dependence on supplemental oxygen: Secondary | ICD-10-CM | POA: Diagnosis not present

## 2019-04-20 DIAGNOSIS — R251 Tremor, unspecified: Secondary | ICD-10-CM | POA: Diagnosis not present

## 2019-04-20 DIAGNOSIS — I10 Essential (primary) hypertension: Secondary | ICD-10-CM | POA: Diagnosis not present

## 2019-04-20 DIAGNOSIS — G4700 Insomnia, unspecified: Secondary | ICD-10-CM | POA: Diagnosis not present

## 2019-04-20 DIAGNOSIS — F329 Major depressive disorder, single episode, unspecified: Secondary | ICD-10-CM | POA: Diagnosis not present

## 2019-04-20 DIAGNOSIS — Z79899 Other long term (current) drug therapy: Secondary | ICD-10-CM | POA: Diagnosis not present

## 2019-04-20 DIAGNOSIS — E7800 Pure hypercholesterolemia, unspecified: Secondary | ICD-10-CM | POA: Diagnosis not present

## 2019-04-20 DIAGNOSIS — J449 Chronic obstructive pulmonary disease, unspecified: Secondary | ICD-10-CM | POA: Diagnosis not present

## 2019-04-20 DIAGNOSIS — K219 Gastro-esophageal reflux disease without esophagitis: Secondary | ICD-10-CM | POA: Diagnosis not present

## 2019-04-20 DIAGNOSIS — E890 Postprocedural hypothyroidism: Secondary | ICD-10-CM | POA: Diagnosis not present

## 2019-07-09 DIAGNOSIS — R0602 Shortness of breath: Secondary | ICD-10-CM | POA: Diagnosis not present

## 2019-07-09 DIAGNOSIS — Z9981 Dependence on supplemental oxygen: Secondary | ICD-10-CM | POA: Diagnosis not present

## 2019-07-09 DIAGNOSIS — J449 Chronic obstructive pulmonary disease, unspecified: Secondary | ICD-10-CM | POA: Diagnosis not present

## 2019-07-09 DIAGNOSIS — R05 Cough: Secondary | ICD-10-CM | POA: Diagnosis not present

## 2019-07-09 DIAGNOSIS — Z01818 Encounter for other preprocedural examination: Secondary | ICD-10-CM | POA: Diagnosis not present

## 2019-07-09 DIAGNOSIS — F17211 Nicotine dependence, cigarettes, in remission: Secondary | ICD-10-CM | POA: Diagnosis not present

## 2019-07-09 DIAGNOSIS — J310 Chronic rhinitis: Secondary | ICD-10-CM | POA: Diagnosis not present

## 2019-08-08 DIAGNOSIS — J449 Chronic obstructive pulmonary disease, unspecified: Secondary | ICD-10-CM | POA: Diagnosis not present

## 2019-09-07 DIAGNOSIS — J449 Chronic obstructive pulmonary disease, unspecified: Secondary | ICD-10-CM | POA: Diagnosis not present

## 2019-10-08 DIAGNOSIS — J449 Chronic obstructive pulmonary disease, unspecified: Secondary | ICD-10-CM | POA: Diagnosis not present

## 2019-10-19 DIAGNOSIS — J449 Chronic obstructive pulmonary disease, unspecified: Secondary | ICD-10-CM | POA: Diagnosis not present

## 2019-10-19 DIAGNOSIS — I10 Essential (primary) hypertension: Secondary | ICD-10-CM | POA: Diagnosis not present

## 2019-10-19 DIAGNOSIS — F329 Major depressive disorder, single episode, unspecified: Secondary | ICD-10-CM | POA: Diagnosis not present

## 2019-10-19 DIAGNOSIS — R251 Tremor, unspecified: Secondary | ICD-10-CM | POA: Diagnosis not present

## 2019-10-19 DIAGNOSIS — Z79899 Other long term (current) drug therapy: Secondary | ICD-10-CM | POA: Diagnosis not present

## 2019-10-19 DIAGNOSIS — E890 Postprocedural hypothyroidism: Secondary | ICD-10-CM | POA: Diagnosis not present

## 2019-10-19 DIAGNOSIS — E7800 Pure hypercholesterolemia, unspecified: Secondary | ICD-10-CM | POA: Diagnosis not present

## 2019-10-19 DIAGNOSIS — K219 Gastro-esophageal reflux disease without esophagitis: Secondary | ICD-10-CM | POA: Diagnosis not present

## 2019-10-19 DIAGNOSIS — G4700 Insomnia, unspecified: Secondary | ICD-10-CM | POA: Diagnosis not present

## 2019-11-07 DIAGNOSIS — J449 Chronic obstructive pulmonary disease, unspecified: Secondary | ICD-10-CM | POA: Diagnosis not present

## 2019-12-08 DIAGNOSIS — J449 Chronic obstructive pulmonary disease, unspecified: Secondary | ICD-10-CM | POA: Diagnosis not present

## 2019-12-17 DIAGNOSIS — J439 Emphysema, unspecified: Secondary | ICD-10-CM | POA: Diagnosis not present

## 2019-12-17 DIAGNOSIS — R05 Cough: Secondary | ICD-10-CM | POA: Diagnosis not present

## 2019-12-17 DIAGNOSIS — R0600 Dyspnea, unspecified: Secondary | ICD-10-CM | POA: Diagnosis not present

## 2019-12-18 ENCOUNTER — Other Ambulatory Visit: Payer: Self-pay | Admitting: Specialist

## 2019-12-18 DIAGNOSIS — J439 Emphysema, unspecified: Secondary | ICD-10-CM

## 2019-12-18 DIAGNOSIS — R0609 Other forms of dyspnea: Secondary | ICD-10-CM

## 2019-12-24 DIAGNOSIS — Z7952 Long term (current) use of systemic steroids: Secondary | ICD-10-CM | POA: Diagnosis not present

## 2019-12-24 DIAGNOSIS — Z1231 Encounter for screening mammogram for malignant neoplasm of breast: Secondary | ICD-10-CM | POA: Diagnosis not present

## 2019-12-24 DIAGNOSIS — M8589 Other specified disorders of bone density and structure, multiple sites: Secondary | ICD-10-CM | POA: Diagnosis not present

## 2020-01-08 DIAGNOSIS — J449 Chronic obstructive pulmonary disease, unspecified: Secondary | ICD-10-CM | POA: Diagnosis not present

## 2020-01-18 ENCOUNTER — Ambulatory Visit: Admission: RE | Admit: 2020-01-18 | Payer: Medicare HMO | Source: Ambulatory Visit

## 2020-01-25 ENCOUNTER — Other Ambulatory Visit: Payer: Self-pay

## 2020-01-25 ENCOUNTER — Ambulatory Visit
Admission: RE | Admit: 2020-01-25 | Discharge: 2020-01-25 | Disposition: A | Discharge status: Home / Self Care | Payer: Medicare HMO | Source: Ambulatory Visit | Attending: Specialist | Admitting: Specialist

## 2020-01-25 DIAGNOSIS — R0600 Dyspnea, unspecified: Secondary | ICD-10-CM | POA: Diagnosis not present

## 2020-01-25 DIAGNOSIS — R0609 Other forms of dyspnea: Secondary | ICD-10-CM

## 2020-01-25 DIAGNOSIS — R0602 Shortness of breath: Secondary | ICD-10-CM | POA: Diagnosis not present

## 2020-01-25 DIAGNOSIS — J439 Emphysema, unspecified: Secondary | ICD-10-CM

## 2020-01-28 ENCOUNTER — Other Ambulatory Visit: Payer: Self-pay | Admitting: Specialist

## 2020-01-28 DIAGNOSIS — R918 Other nonspecific abnormal finding of lung field: Secondary | ICD-10-CM

## 2020-02-07 DIAGNOSIS — J449 Chronic obstructive pulmonary disease, unspecified: Secondary | ICD-10-CM | POA: Diagnosis not present

## 2020-03-09 DIAGNOSIS — J449 Chronic obstructive pulmonary disease, unspecified: Secondary | ICD-10-CM | POA: Diagnosis not present

## 2020-04-08 DIAGNOSIS — J449 Chronic obstructive pulmonary disease, unspecified: Secondary | ICD-10-CM | POA: Diagnosis not present

## 2020-04-10 DIAGNOSIS — Z9181 History of falling: Secondary | ICD-10-CM | POA: Diagnosis not present

## 2020-04-10 DIAGNOSIS — E785 Hyperlipidemia, unspecified: Secondary | ICD-10-CM | POA: Diagnosis not present

## 2020-04-10 DIAGNOSIS — Z0000 Encounter for general adult medical examination without abnormal findings: Secondary | ICD-10-CM | POA: Diagnosis not present

## 2020-04-10 DIAGNOSIS — Z1331 Encounter for screening for depression: Secondary | ICD-10-CM | POA: Diagnosis not present

## 2020-04-17 DIAGNOSIS — J449 Chronic obstructive pulmonary disease, unspecified: Secondary | ICD-10-CM | POA: Diagnosis not present

## 2020-04-17 DIAGNOSIS — R0602 Shortness of breath: Secondary | ICD-10-CM | POA: Diagnosis not present

## 2020-04-17 DIAGNOSIS — Z9981 Dependence on supplemental oxygen: Secondary | ICD-10-CM | POA: Diagnosis not present

## 2020-04-17 DIAGNOSIS — R918 Other nonspecific abnormal finding of lung field: Secondary | ICD-10-CM | POA: Diagnosis not present

## 2020-04-21 ENCOUNTER — Other Ambulatory Visit: Payer: Self-pay

## 2020-04-21 ENCOUNTER — Ambulatory Visit
Admission: RE | Admit: 2020-04-21 | Discharge: 2020-04-21 | Disposition: A | Discharge status: Home / Self Care | Payer: Medicare HMO | Source: Ambulatory Visit | Attending: Specialist | Admitting: Specialist

## 2020-04-21 DIAGNOSIS — R918 Other nonspecific abnormal finding of lung field: Secondary | ICD-10-CM | POA: Insufficient documentation

## 2020-04-21 DIAGNOSIS — R0602 Shortness of breath: Secondary | ICD-10-CM | POA: Diagnosis not present

## 2020-04-22 ENCOUNTER — Other Ambulatory Visit: Payer: Self-pay | Admitting: Specialist

## 2020-04-22 DIAGNOSIS — I10 Essential (primary) hypertension: Secondary | ICD-10-CM | POA: Diagnosis not present

## 2020-04-22 DIAGNOSIS — Z79899 Other long term (current) drug therapy: Secondary | ICD-10-CM | POA: Diagnosis not present

## 2020-04-22 DIAGNOSIS — J449 Chronic obstructive pulmonary disease, unspecified: Secondary | ICD-10-CM | POA: Diagnosis not present

## 2020-04-22 DIAGNOSIS — G4700 Insomnia, unspecified: Secondary | ICD-10-CM | POA: Diagnosis not present

## 2020-04-22 DIAGNOSIS — E890 Postprocedural hypothyroidism: Secondary | ICD-10-CM | POA: Diagnosis not present

## 2020-04-22 DIAGNOSIS — K219 Gastro-esophageal reflux disease without esophagitis: Secondary | ICD-10-CM | POA: Diagnosis not present

## 2020-04-22 DIAGNOSIS — F32A Depression, unspecified: Secondary | ICD-10-CM | POA: Diagnosis not present

## 2020-04-22 DIAGNOSIS — R251 Tremor, unspecified: Secondary | ICD-10-CM | POA: Diagnosis not present

## 2020-04-22 DIAGNOSIS — E7800 Pure hypercholesterolemia, unspecified: Secondary | ICD-10-CM | POA: Diagnosis not present

## 2020-04-23 ENCOUNTER — Other Ambulatory Visit (HOSPITAL_COMMUNITY): Payer: Self-pay | Admitting: Specialist

## 2020-04-23 ENCOUNTER — Other Ambulatory Visit: Payer: Self-pay | Admitting: Specialist

## 2020-04-23 DIAGNOSIS — R911 Solitary pulmonary nodule: Secondary | ICD-10-CM

## 2020-04-23 DIAGNOSIS — R059 Cough, unspecified: Secondary | ICD-10-CM | POA: Diagnosis not present

## 2020-05-09 DIAGNOSIS — J449 Chronic obstructive pulmonary disease, unspecified: Secondary | ICD-10-CM | POA: Diagnosis not present

## 2020-06-03 DIAGNOSIS — J449 Chronic obstructive pulmonary disease, unspecified: Secondary | ICD-10-CM | POA: Diagnosis not present

## 2020-06-04 DIAGNOSIS — E890 Postprocedural hypothyroidism: Secondary | ICD-10-CM | POA: Diagnosis not present

## 2020-06-09 DIAGNOSIS — J449 Chronic obstructive pulmonary disease, unspecified: Secondary | ICD-10-CM | POA: Diagnosis not present

## 2020-06-23 ENCOUNTER — Ambulatory Visit: Admission: RE | Admit: 2020-06-23 | Payer: Medicare HMO | Source: Ambulatory Visit

## 2020-07-01 ENCOUNTER — Ambulatory Visit
Admission: RE | Admit: 2020-07-01 | Discharge: 2020-07-01 | Disposition: A | Discharge status: Home / Self Care | Payer: Medicare HMO | Source: Ambulatory Visit | Attending: Specialist | Admitting: Specialist

## 2020-07-01 ENCOUNTER — Other Ambulatory Visit: Payer: Self-pay

## 2020-07-01 DIAGNOSIS — I2510 Atherosclerotic heart disease of native coronary artery without angina pectoris: Secondary | ICD-10-CM | POA: Diagnosis not present

## 2020-07-01 DIAGNOSIS — J439 Emphysema, unspecified: Secondary | ICD-10-CM | POA: Diagnosis not present

## 2020-07-01 DIAGNOSIS — I700 Atherosclerosis of aorta: Secondary | ICD-10-CM | POA: Diagnosis not present

## 2020-07-01 DIAGNOSIS — J8410 Pulmonary fibrosis, unspecified: Secondary | ICD-10-CM | POA: Diagnosis not present

## 2020-07-01 DIAGNOSIS — R911 Solitary pulmonary nodule: Secondary | ICD-10-CM | POA: Insufficient documentation

## 2020-07-07 DIAGNOSIS — J449 Chronic obstructive pulmonary disease, unspecified: Secondary | ICD-10-CM | POA: Diagnosis not present

## 2020-07-29 DIAGNOSIS — R9389 Abnormal findings on diagnostic imaging of other specified body structures: Secondary | ICD-10-CM | POA: Diagnosis not present

## 2020-07-29 DIAGNOSIS — R059 Cough, unspecified: Secondary | ICD-10-CM | POA: Diagnosis not present

## 2020-07-29 DIAGNOSIS — R0600 Dyspnea, unspecified: Secondary | ICD-10-CM | POA: Diagnosis not present

## 2020-07-29 DIAGNOSIS — Z9981 Dependence on supplemental oxygen: Secondary | ICD-10-CM | POA: Diagnosis not present

## 2020-07-29 DIAGNOSIS — J439 Emphysema, unspecified: Secondary | ICD-10-CM | POA: Diagnosis not present

## 2020-08-07 DIAGNOSIS — J449 Chronic obstructive pulmonary disease, unspecified: Secondary | ICD-10-CM | POA: Diagnosis not present

## 2020-08-28 DIAGNOSIS — R918 Other nonspecific abnormal finding of lung field: Secondary | ICD-10-CM | POA: Diagnosis not present

## 2020-08-28 DIAGNOSIS — J439 Emphysema, unspecified: Secondary | ICD-10-CM | POA: Diagnosis not present

## 2020-08-28 DIAGNOSIS — R0600 Dyspnea, unspecified: Secondary | ICD-10-CM | POA: Diagnosis not present

## 2020-08-28 DIAGNOSIS — Z9981 Dependence on supplemental oxygen: Secondary | ICD-10-CM | POA: Diagnosis not present

## 2020-08-28 DIAGNOSIS — R911 Solitary pulmonary nodule: Secondary | ICD-10-CM | POA: Diagnosis not present

## 2020-09-06 DIAGNOSIS — J449 Chronic obstructive pulmonary disease, unspecified: Secondary | ICD-10-CM | POA: Diagnosis not present

## 2020-10-07 DIAGNOSIS — J449 Chronic obstructive pulmonary disease, unspecified: Secondary | ICD-10-CM | POA: Diagnosis not present

## 2020-10-21 DIAGNOSIS — J449 Chronic obstructive pulmonary disease, unspecified: Secondary | ICD-10-CM | POA: Diagnosis not present

## 2020-10-21 DIAGNOSIS — I10 Essential (primary) hypertension: Secondary | ICD-10-CM | POA: Diagnosis not present

## 2020-10-21 DIAGNOSIS — E7800 Pure hypercholesterolemia, unspecified: Secondary | ICD-10-CM | POA: Diagnosis not present

## 2020-10-21 DIAGNOSIS — Z79899 Other long term (current) drug therapy: Secondary | ICD-10-CM | POA: Diagnosis not present

## 2020-10-21 DIAGNOSIS — K219 Gastro-esophageal reflux disease without esophagitis: Secondary | ICD-10-CM | POA: Diagnosis not present

## 2020-10-21 DIAGNOSIS — R251 Tremor, unspecified: Secondary | ICD-10-CM | POA: Diagnosis not present

## 2020-10-21 DIAGNOSIS — E890 Postprocedural hypothyroidism: Secondary | ICD-10-CM | POA: Diagnosis not present

## 2020-10-21 DIAGNOSIS — G4700 Insomnia, unspecified: Secondary | ICD-10-CM | POA: Diagnosis not present

## 2020-10-21 DIAGNOSIS — F32A Depression, unspecified: Secondary | ICD-10-CM | POA: Diagnosis not present

## 2020-11-06 DIAGNOSIS — E785 Hyperlipidemia, unspecified: Secondary | ICD-10-CM | POA: Diagnosis not present

## 2020-11-06 DIAGNOSIS — Z0000 Encounter for general adult medical examination without abnormal findings: Secondary | ICD-10-CM | POA: Diagnosis not present

## 2020-11-06 DIAGNOSIS — Z9181 History of falling: Secondary | ICD-10-CM | POA: Diagnosis not present

## 2020-11-06 DIAGNOSIS — J449 Chronic obstructive pulmonary disease, unspecified: Secondary | ICD-10-CM | POA: Diagnosis not present

## 2020-11-06 DIAGNOSIS — Z1331 Encounter for screening for depression: Secondary | ICD-10-CM | POA: Diagnosis not present

## 2020-12-07 DIAGNOSIS — J449 Chronic obstructive pulmonary disease, unspecified: Secondary | ICD-10-CM | POA: Diagnosis not present

## 2021-01-07 DIAGNOSIS — I700 Atherosclerosis of aorta: Secondary | ICD-10-CM | POA: Diagnosis not present

## 2021-01-07 DIAGNOSIS — J849 Interstitial pulmonary disease, unspecified: Secondary | ICD-10-CM | POA: Diagnosis not present

## 2021-01-07 DIAGNOSIS — R918 Other nonspecific abnormal finding of lung field: Secondary | ICD-10-CM | POA: Diagnosis not present

## 2021-01-07 DIAGNOSIS — R9389 Abnormal findings on diagnostic imaging of other specified body structures: Secondary | ICD-10-CM | POA: Diagnosis not present

## 2021-01-07 DIAGNOSIS — J449 Chronic obstructive pulmonary disease, unspecified: Secondary | ICD-10-CM | POA: Diagnosis not present

## 2021-01-07 DIAGNOSIS — R053 Chronic cough: Secondary | ICD-10-CM | POA: Diagnosis not present

## 2021-01-09 ENCOUNTER — Other Ambulatory Visit (HOSPITAL_COMMUNITY): Payer: Self-pay | Admitting: Specialist

## 2021-01-09 ENCOUNTER — Other Ambulatory Visit: Payer: Self-pay | Admitting: Specialist

## 2021-01-09 DIAGNOSIS — J849 Interstitial pulmonary disease, unspecified: Secondary | ICD-10-CM

## 2021-01-28 ENCOUNTER — Ambulatory Visit
Admission: RE | Admit: 2021-01-28 | Discharge: 2021-01-28 | Disposition: A | Discharge status: Home / Self Care | Payer: Medicare HMO | Source: Ambulatory Visit | Attending: Specialist | Admitting: Specialist

## 2021-01-28 ENCOUNTER — Other Ambulatory Visit: Payer: Self-pay

## 2021-01-28 DIAGNOSIS — J849 Interstitial pulmonary disease, unspecified: Secondary | ICD-10-CM | POA: Diagnosis not present

## 2021-01-28 DIAGNOSIS — R0602 Shortness of breath: Secondary | ICD-10-CM | POA: Diagnosis not present

## 2021-01-28 DIAGNOSIS — R911 Solitary pulmonary nodule: Secondary | ICD-10-CM | POA: Diagnosis not present

## 2021-01-28 DIAGNOSIS — J439 Emphysema, unspecified: Secondary | ICD-10-CM | POA: Diagnosis not present

## 2021-01-28 DIAGNOSIS — I700 Atherosclerosis of aorta: Secondary | ICD-10-CM | POA: Diagnosis not present

## 2021-02-03 IMAGING — CT CT CHEST W/O CM
2 of 4 series · 15 of 36 positions shown, 18 images · non-contrast
Comparison: 09/05/2018.

CLINICAL DATA: Chronic cough, severe shortness of breath with
activity. On oxygen therapy.

EXAM:
CT CHEST WITHOUT CONTRAST
TECHNIQUE: Multidetector CT imaging of the chest was performed following the
standard protocol without IV contrast.

[Series 2: chest 2.00 · axial · 0.63mm/px · z∈[-1149,-877]mm · 12 of 162 slices shown, 15 images]
[im 13/162  mediastinal]
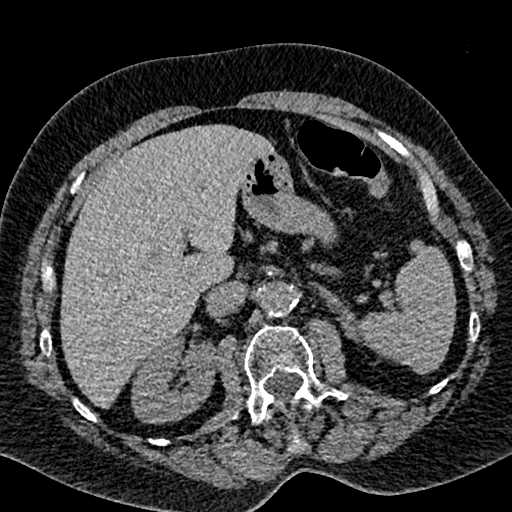
[im 13/162  lung]
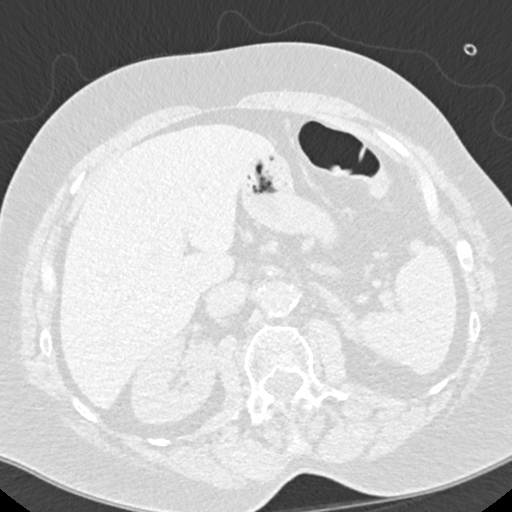
[im 25/162  lung]
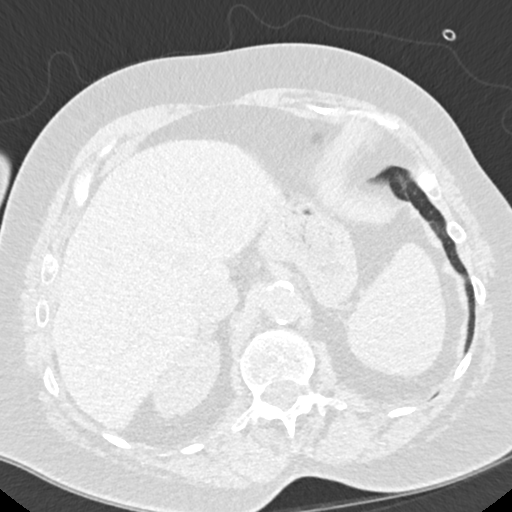
[im 38/162  lung]
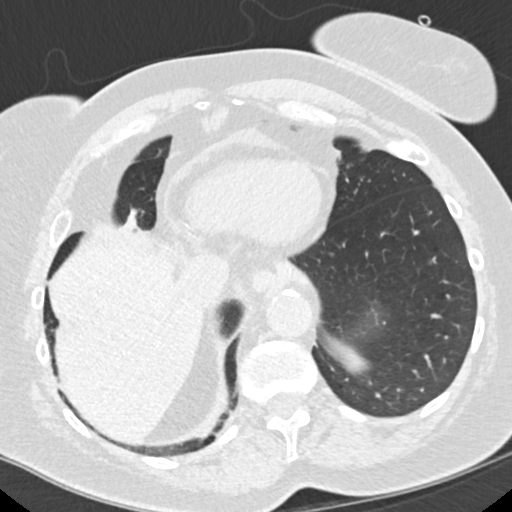
[im 50/162  lung]
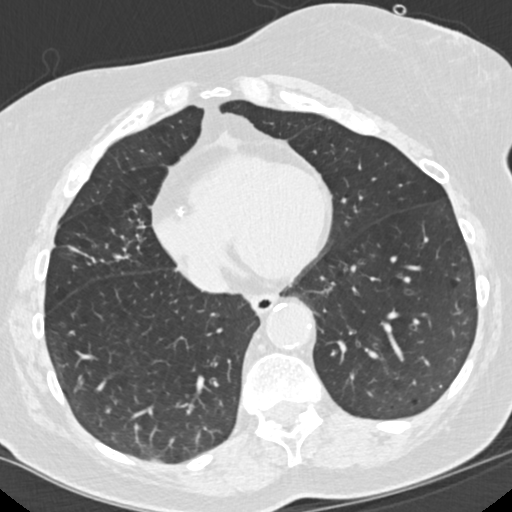
[im 62/162  mediastinal]
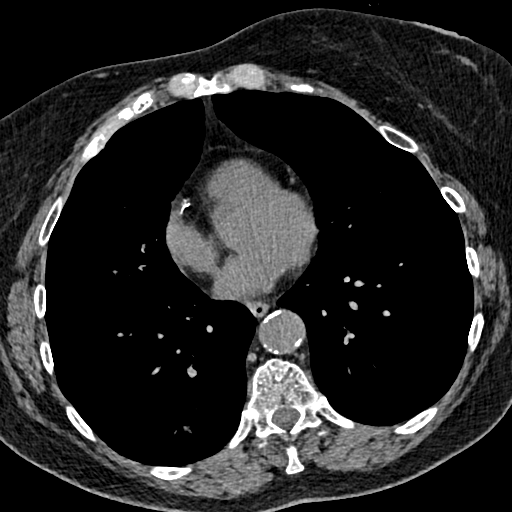
[im 62/162  lung]
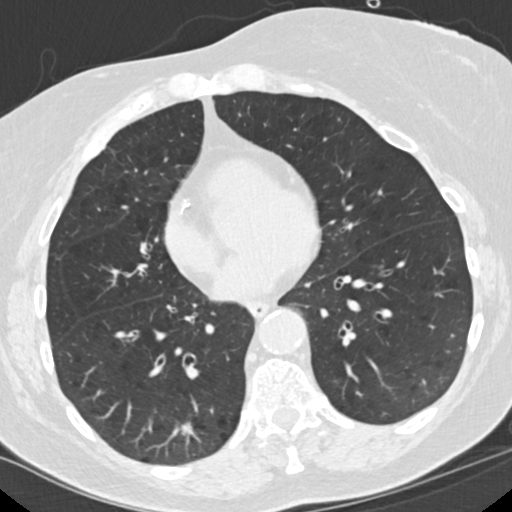
[im 75/162  lung]
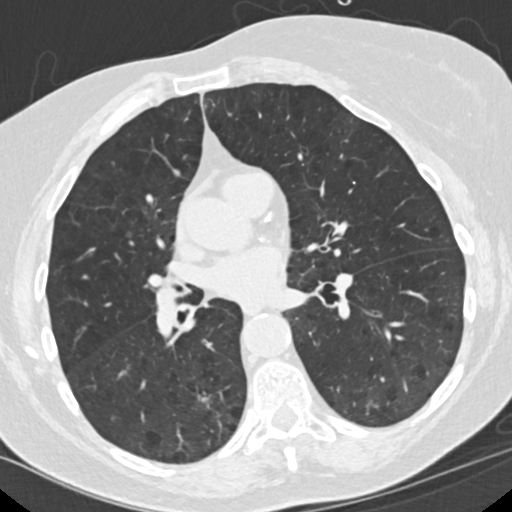
[im 87/162  lung]
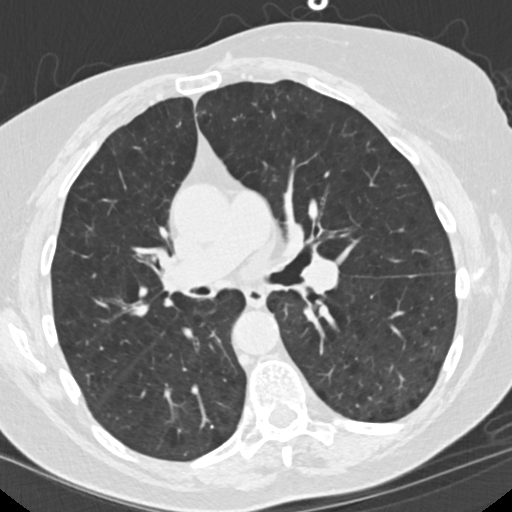
[im 100/162  lung]
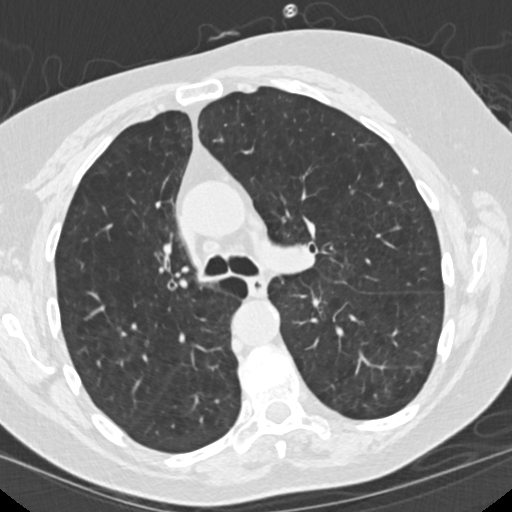
[im 112/162  mediastinal]
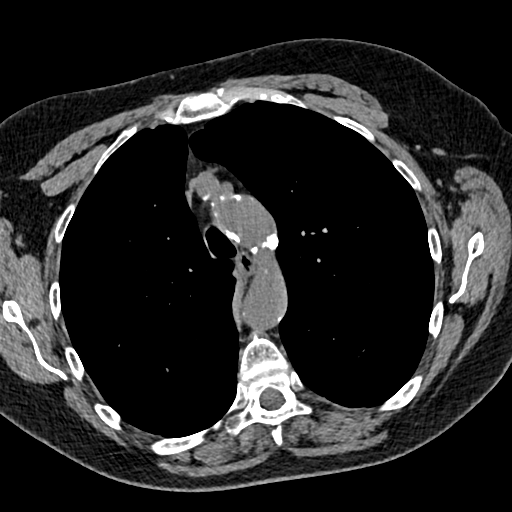
[im 112/162  lung]
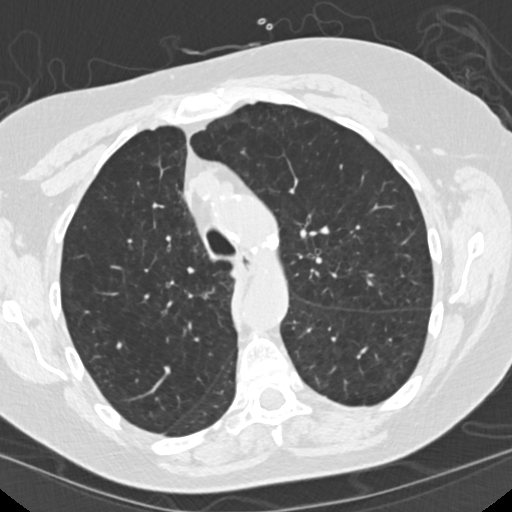
[im 124/162  lung]
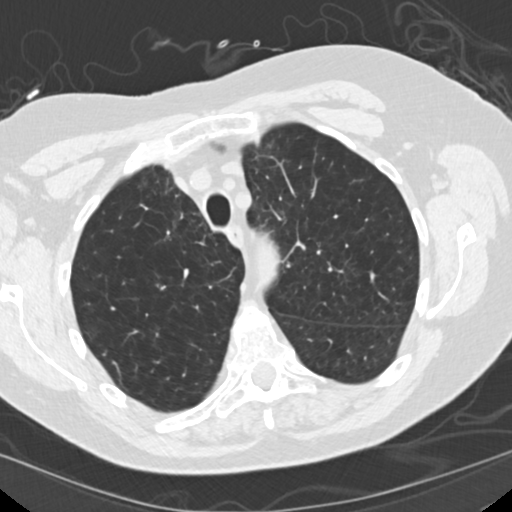
[im 137/162  lung]
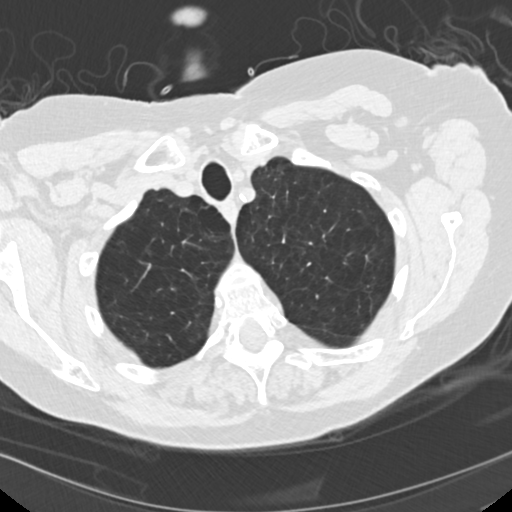
[im 149/162  lung]
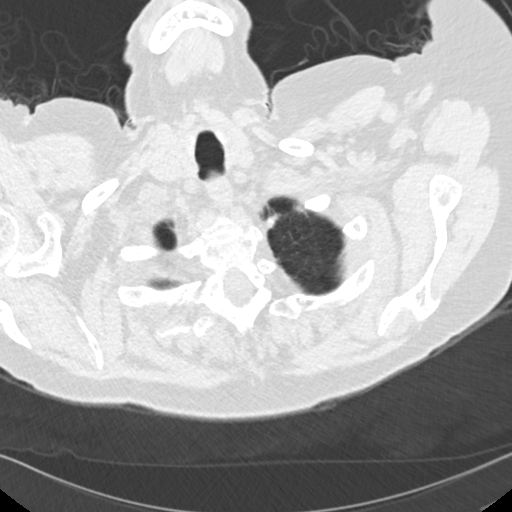

[Series 5: coronals chest 2.00 cor · coronal · 0.63mm/px · 3 of 153 slices shown]
[im 31/153  lung]
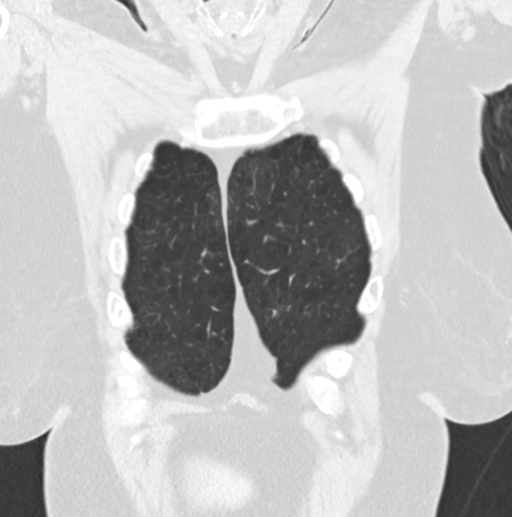
[im 61/153  lung]
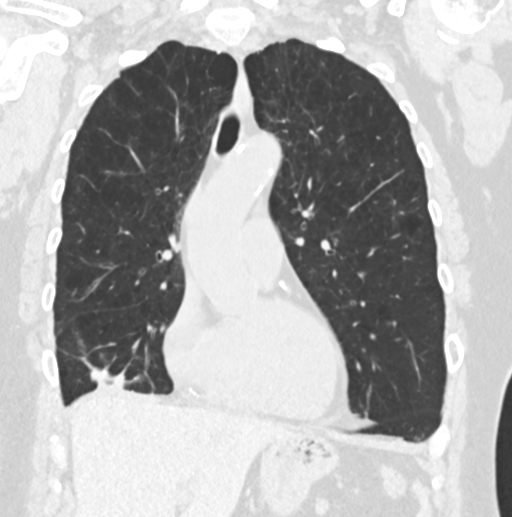
[im 92/153  lung]
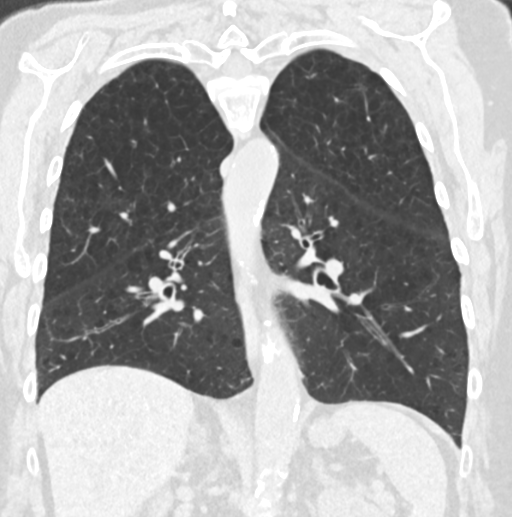

[15 of 36 positions shown; findings below may reference images not displayed]

FINDINGS: Cardiovascular: Atherosclerotic calcification of the aorta, aortic
valve and coronary arteries. Heart size normal. Small amount of
pericardial fluid or pericardial thickening, stable.

Mediastinum/Nodes: No pathologically enlarged mediastinal or
axillary lymph nodes. Hilar regions are difficult to definitively
evaluate without IV contrast. Esophagus is grossly unremarkable.

Lungs/Pleura: Centrilobular emphysema. Mild peribronchial
thickening. Subpleural scarring in the right middle lobe (3/121),
unchanged. Additional scattered pleuroparenchymal scarring
bilaterally. New nodular densities are seen in the right lower lobe,
measuring 8 mm (7 x 9 mm, 3/87) and 7 mm (6 x 8 mm, 3/101). Nodular
density in the posterior right lower lobe measures approximately 4
mm (3/59), unchanged and likely benign. There may be a new 5 mm
nodule in the posterior left upper lobe (3/69). Calcified
granulomas. No pleural fluid. Adherent debris is seen in the bronchi
bilaterally.

Upper Abdomen: Visualized portions of the liver, adrenal glands,
kidneys spleen, pancreas stomach and bowel are grossly unremarkable.

Musculoskeletal: Degenerative changes in the spine. No worrisome
lytic or sclerotic lesions.
IMPRESSION: 1. New pulmonary nodular densities measure up to 8 mm. Follow-up CT
chest without contrast in 3 months is recommended as malignancy
cannot be excluded. These results will be called to the ordering
clinician or representative by the Radiologist Assistant, and
communication documented in the PACS or [REDACTED].
2.  Emphysema (CECOH-BZ3.E).
3. Aortic atherosclerosis (CECOH-GOF.F). Coronary artery
calcification.

## 2021-02-06 DIAGNOSIS — J449 Chronic obstructive pulmonary disease, unspecified: Secondary | ICD-10-CM | POA: Diagnosis not present

## 2021-03-09 DIAGNOSIS — J449 Chronic obstructive pulmonary disease, unspecified: Secondary | ICD-10-CM | POA: Diagnosis not present

## 2021-03-31 DIAGNOSIS — Z9981 Dependence on supplemental oxygen: Secondary | ICD-10-CM | POA: Diagnosis not present

## 2021-03-31 DIAGNOSIS — R0602 Shortness of breath: Secondary | ICD-10-CM | POA: Diagnosis not present

## 2021-03-31 DIAGNOSIS — J40 Bronchitis, not specified as acute or chronic: Secondary | ICD-10-CM | POA: Diagnosis not present

## 2021-03-31 DIAGNOSIS — J449 Chronic obstructive pulmonary disease, unspecified: Secondary | ICD-10-CM | POA: Diagnosis not present

## 2021-04-03 ENCOUNTER — Other Ambulatory Visit: Payer: Self-pay | Admitting: Specialist

## 2021-04-03 ENCOUNTER — Other Ambulatory Visit (HOSPITAL_COMMUNITY): Payer: Self-pay | Admitting: Specialist

## 2021-04-03 DIAGNOSIS — R918 Other nonspecific abnormal finding of lung field: Secondary | ICD-10-CM

## 2021-04-08 DIAGNOSIS — J449 Chronic obstructive pulmonary disease, unspecified: Secondary | ICD-10-CM | POA: Diagnosis not present

## 2021-05-09 DIAGNOSIS — J449 Chronic obstructive pulmonary disease, unspecified: Secondary | ICD-10-CM | POA: Diagnosis not present

## 2021-05-11 ENCOUNTER — Ambulatory Visit: Admission: RE | Admit: 2021-05-11 | Payer: Medicare HMO | Source: Ambulatory Visit

## 2021-05-18 DIAGNOSIS — J449 Chronic obstructive pulmonary disease, unspecified: Secondary | ICD-10-CM | POA: Diagnosis not present

## 2021-05-18 DIAGNOSIS — E7800 Pure hypercholesterolemia, unspecified: Secondary | ICD-10-CM | POA: Diagnosis not present

## 2021-05-18 DIAGNOSIS — G4700 Insomnia, unspecified: Secondary | ICD-10-CM | POA: Diagnosis not present

## 2021-05-18 DIAGNOSIS — F410 Panic disorder [episodic paroxysmal anxiety] without agoraphobia: Secondary | ICD-10-CM | POA: Diagnosis not present

## 2021-05-18 DIAGNOSIS — F32A Depression, unspecified: Secondary | ICD-10-CM | POA: Diagnosis not present

## 2021-05-18 DIAGNOSIS — Z23 Encounter for immunization: Secondary | ICD-10-CM | POA: Diagnosis not present

## 2021-05-18 DIAGNOSIS — Z79899 Other long term (current) drug therapy: Secondary | ICD-10-CM | POA: Diagnosis not present

## 2021-05-18 DIAGNOSIS — I10 Essential (primary) hypertension: Secondary | ICD-10-CM | POA: Diagnosis not present

## 2021-05-18 DIAGNOSIS — E890 Postprocedural hypothyroidism: Secondary | ICD-10-CM | POA: Diagnosis not present

## 2021-05-26 ENCOUNTER — Encounter
Admission: RE | Admit: 2021-05-26 | Discharge: 2021-05-26 | Disposition: A | Discharge status: Home / Self Care | Payer: Medicare HMO | Source: Ambulatory Visit | Attending: Specialist | Admitting: Specialist

## 2021-05-26 DIAGNOSIS — R918 Other nonspecific abnormal finding of lung field: Secondary | ICD-10-CM | POA: Diagnosis not present

## 2021-05-26 DIAGNOSIS — I2510 Atherosclerotic heart disease of native coronary artery without angina pectoris: Secondary | ICD-10-CM | POA: Diagnosis not present

## 2021-05-26 DIAGNOSIS — I6522 Occlusion and stenosis of left carotid artery: Secondary | ICD-10-CM | POA: Diagnosis not present

## 2021-05-26 DIAGNOSIS — K6289 Other specified diseases of anus and rectum: Secondary | ICD-10-CM | POA: Diagnosis not present

## 2021-05-26 DIAGNOSIS — J432 Centrilobular emphysema: Secondary | ICD-10-CM | POA: Diagnosis not present

## 2021-05-26 LAB — GLUCOSE, CAPILLARY: Glucose-Capillary: 103 mg/dL — ABNORMAL HIGH (ref 70–99)

## 2021-05-26 MED ORDER — FLUDEOXYGLUCOSE F - 18 (FDG) INJECTION
10.2000 | Freq: Once | INTRAVENOUS | Status: AC | PRN
  Administered 2021-05-26: 12:00:00 10.1 via INTRAVENOUS

## 2021-06-01 DIAGNOSIS — K629 Disease of anus and rectum, unspecified: Secondary | ICD-10-CM | POA: Diagnosis not present

## 2021-06-01 DIAGNOSIS — J449 Chronic obstructive pulmonary disease, unspecified: Secondary | ICD-10-CM | POA: Diagnosis not present

## 2021-06-01 DIAGNOSIS — Z9981 Dependence on supplemental oxygen: Secondary | ICD-10-CM | POA: Diagnosis not present

## 2021-06-01 DIAGNOSIS — R918 Other nonspecific abnormal finding of lung field: Secondary | ICD-10-CM | POA: Diagnosis not present

## 2021-06-01 DIAGNOSIS — R0609 Other forms of dyspnea: Secondary | ICD-10-CM | POA: Diagnosis not present

## 2021-06-09 DIAGNOSIS — J449 Chronic obstructive pulmonary disease, unspecified: Secondary | ICD-10-CM | POA: Diagnosis not present

## 2021-06-15 ENCOUNTER — Encounter: Payer: Self-pay | Admitting: *Deleted

## 2021-06-15 ENCOUNTER — Other Ambulatory Visit: Payer: Self-pay | Admitting: *Deleted

## 2021-06-22 ENCOUNTER — Inpatient Hospital Stay: Payer: Medicare HMO

## 2021-06-22 ENCOUNTER — Other Ambulatory Visit: Payer: Self-pay

## 2021-06-22 ENCOUNTER — Encounter: Payer: Self-pay | Admitting: Oncology

## 2021-06-22 ENCOUNTER — Inpatient Hospital Stay: Payer: Medicare HMO | Attending: Oncology | Admitting: Oncology

## 2021-06-22 VITALS — BP 115/81 | HR 70 | Temp 97.7°F | Resp 16 | Ht 69.0 in | Wt 156.7 lb

## 2021-06-22 DIAGNOSIS — R0602 Shortness of breath: Secondary | ICD-10-CM | POA: Diagnosis not present

## 2021-06-22 DIAGNOSIS — Z79899 Other long term (current) drug therapy: Secondary | ICD-10-CM | POA: Diagnosis not present

## 2021-06-22 DIAGNOSIS — R5383 Other fatigue: Secondary | ICD-10-CM | POA: Insufficient documentation

## 2021-06-22 DIAGNOSIS — R935 Abnormal findings on diagnostic imaging of other abdominal regions, including retroperitoneum: Secondary | ICD-10-CM

## 2021-06-22 DIAGNOSIS — R942 Abnormal results of pulmonary function studies: Secondary | ICD-10-CM | POA: Insufficient documentation

## 2021-06-22 DIAGNOSIS — Z7982 Long term (current) use of aspirin: Secondary | ICD-10-CM | POA: Diagnosis not present

## 2021-06-22 DIAGNOSIS — K6289 Other specified diseases of anus and rectum: Secondary | ICD-10-CM

## 2021-06-22 DIAGNOSIS — Z9981 Dependence on supplemental oxygen: Secondary | ICD-10-CM | POA: Insufficient documentation

## 2021-06-22 DIAGNOSIS — R933 Abnormal findings on diagnostic imaging of other parts of digestive tract: Secondary | ICD-10-CM | POA: Diagnosis not present

## 2021-06-22 DIAGNOSIS — J432 Centrilobular emphysema: Secondary | ICD-10-CM | POA: Insufficient documentation

## 2021-06-22 DIAGNOSIS — Z87891 Personal history of nicotine dependence: Secondary | ICD-10-CM | POA: Insufficient documentation

## 2021-06-22 DIAGNOSIS — I10 Essential (primary) hypertension: Secondary | ICD-10-CM | POA: Insufficient documentation

## 2021-06-22 DIAGNOSIS — R918 Other nonspecific abnormal finding of lung field: Secondary | ICD-10-CM | POA: Diagnosis not present

## 2021-06-22 DIAGNOSIS — Z803 Family history of malignant neoplasm of breast: Secondary | ICD-10-CM | POA: Insufficient documentation

## 2021-06-22 DIAGNOSIS — Z7952 Long term (current) use of systemic steroids: Secondary | ICD-10-CM | POA: Insufficient documentation

## 2021-06-22 LAB — IRON AND TIBC
Iron: 119 ug/dL (ref 28–170)
Saturation Ratios: 31 % (ref 10.4–31.8)
TIBC: 381 ug/dL (ref 250–450)
UIBC: 262 ug/dL

## 2021-06-22 LAB — CBC WITH DIFFERENTIAL/PLATELET
Abs Immature Granulocytes: 0.02 10*3/uL (ref 0.00–0.07)
Basophils Absolute: 0.1 10*3/uL (ref 0.0–0.1)
Basophils Relative: 1 %
Eosinophils Absolute: 0.2 10*3/uL (ref 0.0–0.5)
Eosinophils Relative: 3 %
HCT: 44 % (ref 36.0–46.0)
Hemoglobin: 14.2 g/dL (ref 12.0–15.0)
Immature Granulocytes: 0 %
Lymphocytes Relative: 22 %
Lymphs Abs: 1.6 10*3/uL (ref 0.7–4.0)
MCH: 29.8 pg (ref 26.0–34.0)
MCHC: 32.3 g/dL (ref 30.0–36.0)
MCV: 92.2 fL (ref 80.0–100.0)
Monocytes Absolute: 0.6 10*3/uL (ref 0.1–1.0)
Monocytes Relative: 9 %
Neutro Abs: 4.6 10*3/uL (ref 1.7–7.7)
Neutrophils Relative %: 65 %
Platelets: 151 10*3/uL (ref 150–400)
RBC: 4.77 MIL/uL (ref 3.87–5.11)
RDW: 13.3 % (ref 11.5–15.5)
WBC: 7.1 10*3/uL (ref 4.0–10.5)
nRBC: 0 % (ref 0.0–0.2)

## 2021-06-22 LAB — COMPREHENSIVE METABOLIC PANEL
ALT: 11 U/L (ref 0–44)
AST: 15 U/L (ref 15–41)
Albumin: 4.1 g/dL (ref 3.5–5.0)
Alkaline Phosphatase: 50 U/L (ref 38–126)
Anion gap: 7 (ref 5–15)
BUN: 11 mg/dL (ref 8–23)
CO2: 29 mmol/L (ref 22–32)
Calcium: 9 mg/dL (ref 8.9–10.3)
Chloride: 98 mmol/L (ref 98–111)
Creatinine, Ser: 0.68 mg/dL (ref 0.44–1.00)
GFR, Estimated: >60 mL/min (ref >60)
Glucose, Bld: 95 mg/dL (ref 70–99)
Potassium: 4 mmol/L (ref 3.5–5.1)
Sodium: 134 mmol/L — ABNORMAL LOW (ref 135–145)
Total Bilirubin: 0.3 mg/dL (ref 0.3–1.2)
Total Protein: 7.5 g/dL (ref 6.5–8.1)

## 2021-06-22 LAB — FERRITIN: Ferritin: 90 ng/mL (ref 11–307)

## 2021-06-22 NOTE — Progress Notes (Signed)
Pt has occ. Cough, also she states in am she can cough up yellow/green but rest of the day if she coughs she says it is clear. Has COPD stage 4. She wears oxygen at night. Came here for abnormal scan.

## 2021-06-22 NOTE — Progress Notes (Signed)
Hematology/Oncology Consult note [facility] Telephone:([phone] [phone]  Patient Care Team: Cyndi Bender, Hershal Coria as PCP - General (Physician Assistant) Sindy Guadeloupe, MD as Consulting Physician (Oncology) Erby Pian, MD as Referring Physician (Pulmonary Disease) Clent Jacks, RN as Oncology Nurse Navigator   Name of the patient: Brandi Schultz  [id number]  January 26, 1956    Reason for referral-abnormal PET scan   Referring physician-Dr. Raul Del  Date of visit: 06/22/21   History of presenting illness- Patient is a 66 year old female with past medical history significant for stage IV COPD on home oxygen 2 L, hypertension hyperlipidemia among other medical problems.  She sees Dr. Raul Del for her COPD and underwent CT chest without contrast to follow-up on lung nodules.  This showed a 1.9 x 1.1 cm nodular opacity in the left upper lobe which was not significantly changed as compared to prior CT in March 2022.  PET scan was recommended.  PET scan showed 6 mm with an SUV of 1 left apical lung nodule that was new, spiculated posterior left upper lobe pulmonary lung nodule 1.6 x 1.3 cm with an SUV of 2.4.  Another right upper lobe pulmonary nodule 1.3 cm with an SUV of 3.3.  High rectal area of focal soft tissue thickening with a hypermetabolism of 27.4 overall findings concerning for primary rectal carcinoma.  Spiculated pulmonary lung nodules concerning for primary bronchogenic carcinoma versus metastases from a rectal primary.  Patient was a heavy smoker in the past but quit smoking in 2021.  She lives with her sister and is independent of her ADLs.  Denies any pain presently.  Denies any constipation or blood in stools presently.  States that she has had hemorrhoids in the past and some bleeding from that.  Last colonoscopy was about 10 years ago-perjeta.  Family history significant for breast cancer in her mother.  ECOG PS- 1  Pain scale-  0   Review of systems- Review of Systems  Constitutional:  Positive for malaise/fatigue. Negative for chills, fever and weight loss.  HENT:  Negative for congestion, ear discharge and nosebleeds.   Eyes:  Negative for blurred vision.  Respiratory:  Positive for shortness of breath. Negative for cough, hemoptysis, sputum production and wheezing.   Cardiovascular:  Negative for chest pain, palpitations, orthopnea and claudication.  Gastrointestinal:  Negative for abdominal pain, blood in stool, constipation, diarrhea, heartburn, melena, nausea and vomiting.  Genitourinary:  Negative for dysuria, flank pain, frequency, hematuria and urgency.  Musculoskeletal:  Negative for back pain, joint pain and myalgias.  Skin:  Negative for rash.  Neurological:  Negative for dizziness, tingling, focal weakness, seizures, weakness and headaches.  Endo/Heme/Allergies:  Does not bruise/bleed easily.  Psychiatric/Behavioral:  Negative for depression and suicidal ideas. The patient does not have insomnia.    No Known Allergies  There are no problems to display for this patient.    Past Medical History:  Diagnosis Date   Allergic rhinitis due to allergen    Atherosclerosis    Depression    Essential hypertension, benign    Graves disease    Hypercholesteremia    Insomnia    Interstitial lung disease (HCC)    Myalgia    Pulmonary nodules    Rectal lesion    Stage 4 very severe COPD by GOLD classification ([place])    Statin intolerance    Urge incontinence of urine      Past Surgical History:  Procedure Laterality Date   VAGINAL HYSTERECTOMY  Social History   Socioeconomic History   Marital status: Divorced    Spouse name: Not on file   Number of children: Not on file   Years of education: Not on file   Highest education level: Not on file  Occupational History   Not on file  Tobacco Use   Smoking status: Former    Packs/day: 2.30    Years: 50.00    Pack years: 115.00     Types: Cigarettes    Quit date: 05/2019    Years since quitting: 2.1    Passive exposure: Never   Smokeless tobacco: Never   Tobacco comments:    pt averaged 1.5 packs for 50 years, 50 of those years, pt smoked 3 packs. Total is abot 115 pack/year,   Vaping Use   Vaping Use: Never used  Substance and Sexual Activity   Alcohol use: Not Currently   Drug use: Not Currently   Sexual activity: Not Currently  Other Topics Concern   Not on file  Social History Narrative   Not on file   Social Determinants of Health   Financial Resource Strain: Not on file  Food Insecurity: Not on file  Transportation Needs: Not on file  Physical Activity: Not on file  Stress: Not on file  Social Connections: Not on file  Intimate Partner Violence: Not on file     No family history on file.   Current Outpatient Medications:    albuterol (PROVENTIL) (2.5 MG/3ML) 0.083% nebulizer solution, Inhale into the lungs every 4 (four) hours as needed., Disp: , Rfl:    albuterol (VENTOLIN HFA) 108 (90 Base) MCG/ACT inhaler, 2 puffs every 4 (four) hours as needed (wheezing)., Disp: , Rfl:    aspirin 81 MG EC tablet, Take 1 tablet by mouth daily., Disp: , Rfl:    atorvastatin (LIPITOR) 10 MG tablet, Take 1 tablet by mouth daily., Disp: , Rfl:    benzonatate (TESSALON) 100 MG capsule, Take 1 capsule by mouth 3 (three) times daily as needed., Disp: , Rfl:    clonazePAM (KLONOPIN) 0.5 MG tablet, Take 0.5 mg by mouth 2 (two) times daily as needed for anxiety., Disp: , Rfl:    cyanocobalamin 1000 MCG tablet, Take 1 tablet by mouth daily., Disp: , Rfl:    dicyclomine (BENTYL) 10 MG capsule, Take 1 capsule by mouth daily., Disp: , Rfl:    fluticasone (FLONASE) 50 MCG/ACT nasal spray, Place 1 spray into the nose daily., Disp: , Rfl:    ibandronate (BONIVA) 150 MG tablet, Take 1 tablet by mouth every 30 (thirty) days., Disp: , Rfl:    Iron-Vitamins (GERITOL COMPLETE PO), Take 1 tablet by mouth daily., Disp: , Rfl:     levothyroxine (SYNTHROID) 125 MCG tablet, Take 1 tablet by mouth daily., Disp: , Rfl:    montelukast (SINGULAIR) 10 MG tablet, Take 10 mg by mouth at bedtime., Disp: , Rfl:    nebivolol (BYSTOLIC) 5 MG tablet, Take 1 tablet by mouth daily., Disp: , Rfl:    omeprazole (PRILOSEC) 40 MG capsule, Take 40 mg by mouth daily., Disp: , Rfl:    sertraline (ZOLOFT) 100 MG tablet, Take 1 tablet by mouth daily., Disp: , Rfl:    theophylline (THEO-24) 300 MG 24 hr capsule, Take 300 mg by mouth daily., Disp: , Rfl:    tiZANidine (ZANAFLEX) 4 MG capsule, Take 4 mg by mouth daily as needed for muscle spasms., Disp: , Rfl:    tolterodine (DETROL LA) 4 MG 24 hr capsule,  Take 1 capsule by mouth daily., Disp: , Rfl:    TRELEGY ELLIPTA 100-62.5-25 MCG/ACT AEPB, , Disp: , Rfl:    predniSONE (DELTASONE) 10 MG tablet, Take 1 tablet by mouth every other day., Disp: , Rfl:    Physical exam:  Vitals:   06/22/21 1401  BP: 115/81  Pulse: 70  Resp: 16  Temp: 97.7 F (36.5 C)  TempSrc: Oral  SpO2: 98%  Weight: 156 lb 11.2 oz (71.1 kg)  Height: 5\' 9"  (1.753 m)   Physical Exam Constitutional:      Comments: Sitting in a wheelchair and appears in no acute distress.  On home oxygen  Cardiovascular:     Rate and Rhythm: Normal rate and regular rhythm.     Heart sounds: Normal heart sounds.  Pulmonary:     Effort: Pulmonary effort is normal.     Breath sounds: Normal breath sounds.  Abdominal:     General: Bowel sounds are normal.     Palpations: Abdomen is soft.  Skin:    General: Skin is warm and dry.  Neurological:     Mental Status: She is alert and oriented to person, place, and time.       CMP Latest Ref Rng & Units 06/22/2021  Glucose 70 - 99 mg/dL 95  BUN 8 - 23 mg/dL 11  Creatinine 0.44 - 1.00 mg/dL 0.68  Sodium 135 - 145 mmol/L 134(L)  Potassium 3.5 - 5.1 mmol/L 4.0  Chloride 98 - 111 mmol/L 98  CO2 22 - 32 mmol/L 29  Calcium 8.9 - 10.3 mg/dL 9.0  Total Protein 6.5 - 8.1 g/dL 7.5  Total  Bilirubin 0.3 - 1.2 mg/dL 0.3  Alkaline Phos 38 - 126 U/L 50  AST 15 - 41 U/L 15  ALT 0 - 44 U/L 11   CBC Latest Ref Rng & Units 06/22/2021  WBC 4.0 - 10.5 K/uL 7.1  Hemoglobin 12.0 - 15.0 g/dL 14.2  Hematocrit 36.0 - 46.0 % 44.0  Platelets 150 - 400 K/uL 151    No images are attached to the encounter.  NM PET Image Initial (PI) Skull Base To Thigh (F-18 FDG)  Result Date: 05/27/2021 CLINICAL DATA:  Initial treatment strategy for posterior left upper lobe pulmonary nodule on prior chest CTs. EXAM: NUCLEAR MEDICINE PET SKULL BASE TO THIGH TECHNIQUE: 10.1 mCi F-18 FDG was injected intravenously. Full-ring PET imaging was performed from the skull base to thigh after the radiotracer. CT data was obtained and used for attenuation correction and anatomic localization. Fasting blood glucose: 103 mg/dl COMPARISON:  Chest CTs, most recent 01/28/2021 FINDINGS: Mediastinal blood pool activity: SUV max 2.4 Liver activity: SUV max NA NECK: Muscular activity within the neck is presumably secondary to motion after radiopharmaceutical. This diminishes sensitivity for cervical nodal activity. No gross hypermetabolic cervical nodes. Incidental CT findings: No cervical adenopathy. Left carotid atherosclerosis. CHEST: No thoracic nodal hypermetabolism. A left apical relatively linear (in craniocaudal direction) 6 mm nodular density measures a S.U.V. max of 1.0 on 74/3. This is new or enlarged compared to 01/28/2021. The spiculated posterior left upper lobe pulmonary nodule detailed on prior CTs measures 1.6 x 1.3 cm and a S.U.V. max of 2.4 on 94/3. Similar in size to on the prior CT. Spiculated posterior right upper lobe pulmonary nodule measures 1.3 cm and a S.U.V. max of 3.3 on 103/3. This measured 7 mm on image 67/3 of the 01/28/2021 CT, and was absent on 07/01/2020 CT. Incidental CT findings: Moderate centrilobular emphysema. Right middle  lobe scarring. There may be a lateral right lower lobe 7 mm nodule on 125/3  which is not readily apparent on the prior chest CT and is below PET resolution. A developing more posterolateral left upper lobe 6 mm irregular nodule on 83/3 is new since the prior chest CT. Calcified left lower lobe granuloma. ABDOMEN/PELVIS: A high rectal area of focal soft tissue thickening is eccentric right on 232/3 and corresponds to marked hypermetabolism at 27.4. No abdominopelvic nodal hypermetabolism. Incidental CT findings: Segment 3 1.2 cm hypoattenuating lesion on 163/3 is well-circumscribed on prior chest CTs and similar, favoring a cyst or minimally complex cyst. Normal adrenal glands. Normal appearance of the pancreas, gallbladder, kidneys. Pelvic floor laxity. Hysterectomy. SKELETON: No abnormal marrow activity. Incidental CT findings: Osteopenia. IMPRESSION: 1. High rectal marked hypermetabolism and soft tissue thickening, highly suspicious for primary rectal carcinoma. 2. Multiple spiculated pulmonary nodules, the most suspicious of which is an enlarging posterior right upper lobe nodule which is new compared to 07/01/2020. The posterior left upper lobe spiculated nodule is similar in size over prior exams but also hypermetabolic. Favor primary bronchogenic carcinoma or carcinomas. Metastatic disease from rectal primary felt less likely. Consider tissue sampling. 3. Incidental findings, including: Aortic atherosclerosis (ICD10-I70.0), coronary artery atherosclerosis and emphysema (ICD10-J43.9). Electronically Signed   By: Abigail Miyamoto M.D.   On: 05/27/2021 14:57    Assessment and plan- Patient is a 66 y.o. female referred for spiculated lung nodules and hypermetabolism noted in the rectum  Hypermetabolic area noted in proximal rectum: She will need a sigmoidoscopy at the very least if not ideally a colonoscopy.  It remains to be seen if she can be cleared from pulmonary standpoint to go through anesthesia for procedure.  Patient has been referred to [facility] and we will await biopsies before  recommending further management  Spiculated lung nodules: Remains to be seen if patient would be a candidate for bronchoscopy or CT-guided biopsy given her significant COPD.  We will discuss her case at tumor board this week  I will see her back after rectal biopsy is back and after tumor board discussion.  We will check CBC with differential CMP ferritin iron studies and CEA today   Thank you for this kind referral and the opportunity to participate in the care of this patient   Visit Diagnosis 1. Abnormal abdominal CT scan   2. Rectal mass     Dr. Randa Evens, MD, MPH [facility] at [facility] [phone] 06/22/2021

## 2021-06-22 NOTE — Progress Notes (Signed)
Met with Ms. Brandi Schultz and introduced Publishing copy. Provided my contact information for future needs. Noted that Dr. Raul Del has already entered referral for GI services. Called [facility] and she was rescheduled for 06/23/21 at 1100 with M. Croley. I have left a voicemail with Ms. Brandi Schultz with her appointment details. I have asked her to call back to confirm she can keep this appointment.

## 2021-06-23 ENCOUNTER — Telehealth: Payer: Self-pay

## 2021-06-23 LAB — CEA: CEA: 11.3 ng/mL — ABNORMAL HIGH (ref 0.0–4.7)

## 2021-06-23 NOTE — Telephone Encounter (Signed)
Brandi Schultz returned call. She got my message and did not present to the GI appointment that was arranged for her today. They had offered to reschedule her to tomorrow and she states she is not sure can do that. I have provided her the phone number to [facility] and asked her to call to get an appointment asap and sent [facility] a message regarding same.

## 2021-06-23 NOTE — Telephone Encounter (Signed)
Brandi Schultz called back and states they scheduled her GI appointment in April. I told her this was to long to wait. She declined the appointment tomorrow and stated it is just to much on her to do to many appointments at once. She has requested to wait about three weeks. I have reached out to [facility] and asked if they can see her in 2-3 weeks at her request.

## 2021-06-23 NOTE — Telephone Encounter (Signed)
Left another voicemail this morning regarding appointment with [facility]. Asked for  a call back to ensure she has received message.

## 2021-06-25 ENCOUNTER — Other Ambulatory Visit: Payer: Medicare HMO

## 2021-06-25 NOTE — Progress Notes (Signed)
Tumor Board Documentation  Brandi Schultz was presented by Dr Janese Banks at our Tumor Board on 06/25/2021, which included representatives from radiation oncology, radiology, pathology.  Brandi Schultz currently presents as a new patient, for discussion with history of the following treatments: active survellience.  Additionally, we reviewed previous medical and familial history, history of present illness, and recent lab results along with all available histopathologic and imaging studies. The tumor board considered available treatment options and made the following recommendations: Biopsy, Concurrent chemo-radiation therapy Refer to Dr Carney Corners in [place] for Robotic Bronchoscopy,   Refer to Dr Baruch Gouty,  Colonoscopy  The following procedures/referrals were also placed: No orders of the defined types were placed in this encounter.   Clinical Trial Status: not discussed   Staging used: To be determined AJCC Staging:       Group: Lung mass, Rectal mass   National site-specific guidelines   were discussed with respect to the case.  Tumor board is a meeting of clinicians from various specialty areas who evaluate and discuss patients for whom a multidisciplinary approach is being considered. Final determinations in the plan of care are those of the provider(s). The responsibility for follow up of recommendations given during tumor board is that of the provider.   Today's extended care, comprehensive team conference, Brandi Schultz was not present for the discussion and was not examined.   Multidisciplinary Tumor Board is a multidisciplinary case peer review process.  Decisions discussed in the Multidisciplinary Tumor Board reflect the opinions of the specialists present at the conference without having examined the patient.  Ultimately, treatment and diagnostic decisions rest with the primary provider(s) and the patient.

## 2021-06-26 DIAGNOSIS — R948 Abnormal results of function studies of other organs and systems: Secondary | ICD-10-CM | POA: Diagnosis not present

## 2021-06-26 DIAGNOSIS — R634 Abnormal weight loss: Secondary | ICD-10-CM | POA: Diagnosis not present

## 2021-06-29 ENCOUNTER — Telehealth: Payer: Self-pay

## 2021-06-29 ENCOUNTER — Ambulatory Visit: Admit: 2021-06-29 | Payer: Medicare HMO

## 2021-06-29 SURGERY — SIGMOIDOSCOPY, FLEXIBLE

## 2021-06-29 NOTE — Telephone Encounter (Signed)
Received call from Ms. Starleen Blue regarding a call she received from [place] from a lung physician. In reading tumor board note this is likely from Dr. Juline Patch office. Explained purpose for seeing him per tumor board note. She voiced understanding and will make this appointment. Transportation is an issue for her. She did not make her flex sigmoidoscopy today due to transportation. She will call office and get this rescheduled.

## 2021-07-01 ENCOUNTER — Telehealth: Payer: Self-pay | Admitting: Pulmonary Disease

## 2021-07-01 NOTE — Telephone Encounter (Signed)
Left voicemail for patient to call back to schedule a consultation with Dr. Valeta Harms to discuss biopsy options and bronchoscopy for evaluation of lung nodules. . Patient was referred by Dr. Janese Banks.  ?

## 2021-07-03 ENCOUNTER — Encounter: Payer: Self-pay | Admitting: *Deleted

## 2021-07-06 ENCOUNTER — Ambulatory Visit
Admission: RE | Admit: 2021-07-06 | Discharge: 2021-07-06 | Disposition: A | Discharge status: Home / Self Care | Payer: Medicare HMO | Attending: Gastroenterology | Admitting: Gastroenterology

## 2021-07-06 ENCOUNTER — Encounter
Admission: RE | Disposition: A | Discharge status: Home / Self Care | Payer: Self-pay | Source: Home / Self Care | Attending: Gastroenterology

## 2021-07-06 ENCOUNTER — Encounter: Payer: Self-pay | Admitting: *Deleted

## 2021-07-06 DIAGNOSIS — D128 Benign neoplasm of rectum: Secondary | ICD-10-CM | POA: Diagnosis not present

## 2021-07-06 DIAGNOSIS — J449 Chronic obstructive pulmonary disease, unspecified: Secondary | ICD-10-CM | POA: Diagnosis not present

## 2021-07-06 DIAGNOSIS — R933 Abnormal findings on diagnostic imaging of other parts of digestive tract: Secondary | ICD-10-CM | POA: Insufficient documentation

## 2021-07-06 DIAGNOSIS — Z9981 Dependence on supplemental oxygen: Secondary | ICD-10-CM | POA: Diagnosis not present

## 2021-07-06 DIAGNOSIS — Z538 Procedure and treatment not carried out for other reasons: Secondary | ICD-10-CM | POA: Diagnosis not present

## 2021-07-06 HISTORY — PX: FLEXIBLE SIGMOIDOSCOPY: SHX5431

## 2021-07-06 HISTORY — DX: Irritable bowel syndrome, unspecified: K58.9

## 2021-07-06 SURGERY — SIGMOIDOSCOPY, FLEXIBLE
Anesthesia: General

## 2021-07-06 MED ORDER — SODIUM CHLORIDE 0.9 % IV SOLN
INTRAVENOUS | Status: DC
  Administered 2021-07-06: 1000 mL via INTRAVENOUS

## 2021-07-06 NOTE — Interval H&P Note (Signed)
History and Physical Interval Note: ? ?07/06/2021 ?1:28 PM ? ?Brandi Schultz  has presented today for surgery, with the diagnosis of ABNORMAL PET SCAN  OF RECTAL AREA.  The various methods of treatment have been discussed with the patient and family. After consideration of risks, benefits and other options for treatment, the patient has consented to  Procedure(s): ?FLEXIBLE SIGMOIDOSCOPY (N/A) as a surgical intervention.  The patient's history has been reviewed, patient examined, no change in status, stable for surgery.  I have reviewed the patient's chart and labs.  Questions were answered to the patient's satisfaction.   ? ? ?H[place] Jemari Hallum ? ?Ok to proceed with flex sig ?

## 2021-07-06 NOTE — Op Note (Signed)
[facility] ?Gastroenterology ?Patient Name: Brandi Schultz ?Procedure Date: 07/06/2021 1:18 PM ?MRN: [id number] ?Account #: [id number] ?Date of Birth: 13-Feb-1956 ?Admit Type: Outpatient ?Age: 66 ?Room: [facility] ENDO ROOM 3 ?Gender: Female ?Note Status: Finalized ?Instrument Name: Upper Endoscope [id number] ?Procedure:             Flexible Sigmoidoscopy ?Indications:           Abnormal PET scan of the GI tract ?Providers:             Andrey Farmer MD, MD ?Referring MD:          Cyndi Bender (Referring MD) ?Medicines:             None ?Complications:         No immediate complications. Estimated blood loss:  ?                       Minimal. ?Procedure:             Pre-Anesthesia Assessment: ?                       - Prior to the procedure, a History and Physical was  ?                       performed, and patient medications and allergies were  ?                       reviewed. The patient is competent. The risks and  ?                       benefits of the procedure and the sedation options and  ?                       risks were discussed with the patient. All questions  ?                       were answered and informed consent was obtained.  ?                       Patient identification and proposed procedure were  ?                       verified by the physician, the nurse and the  ?                       technician in the endoscopy suite. Mental Status  ?                       Examination: alert and oriented. Airway Examination:  ?                       normal oropharyngeal airway and neck mobility.  ?                       Respiratory Examination: clear to auscultation. CV  ?                       Examination: normal. Prophylactic Antibiotics: The  ?  patient does not require prophylactic antibiotics.  ?                       Prior Anticoagulants: The patient has taken no  ?                       previous anticoagulant or antiplatelet agents. ASA  ?                        Grade Assessment: III - A patient with severe systemic  ?                       disease. After reviewing the risks and benefits, the  ?                       patient was deemed in satisfactory condition to  ?                       undergo the procedure. The anesthesia plan was to use  ?                       no sedation or anesthesia. Immediately prior to  ?                       administration of medications, the patient was  ?                       re-assessed for adequacy to receive sedatives. The  ?                       heart rate, respiratory rate, oxygen saturations,  ?                       blood pressure, adequacy of pulmonary ventilation, and  ?                       response to care were monitored throughout the  ?                       procedure. The physical status of the patient was  ?                       re-assessed after the procedure. ?                       After obtaining informed consent, the scope was passed  ?                       under direct vision. The Endoscope was introduced  ?                       through the anus and advanced to the the rectosigmoid  ?                       junction. The flexible sigmoidoscopy was accomplished  ?                       without difficulty. The patient tolerated the  ?  procedure well. The quality of the bowel preparation  ?                       was good. ?Findings: ?     The perianal and digital rectal examinations were normal. ?     A 20 mm, non-bleeding polyp was found in the proximal rectum. The polyp  ?     was pedunculated. The polyp was removed with a hot snare. Resection and  ?     retrieval were complete. To prevent bleeding post-intervention, one  ?     hemostatic clip was successfully placed. There was no bleeding during,  ?     or at the end, of the procedure. ?     A few 1 to 3 mm polyps were found in the rectum. The polyps were  ?     hyperplastic. Polypectomy was not attempted. ?     The exam was otherwise without  abnormality. ?Impression:            - One 20 mm, non-bleeding polyp in the proximal  ?                       rectum, removed with a hot snare. Resected and  ?                       retrieved. Clip was placed. ?                       - A few 1 to 3 mm polyps in the rectum. Resection not  ?                       attempted. ?                       - The examination was otherwise normal. ?Recommendation:        - Await pathology results. ?                       - Repeat flexible sigmoidoscopy for surveillance based  ?                       on pathology results. ?Procedure Code(s):     --- Professional --- ?                       [phone], 52, Sigmoidoscopy, flexible; with removal of  ?                       tumor(s), polyp(s), or other lesion(s) by snare  ?                       technique ?Diagnosis Code(s):     --- Professional --- ?                       K62.1, Rectal polyp ?                       R93.3, Abnormal findings on diagnostic imaging of  ?                       other parts of digestive tract ?CPT  copyright 2019 [employer]. All rights reserved. ?The codes documented in this report are preliminary and upon coder review may  ?be revised to meet current compliance requirements. ?Andrey Farmer MD, MD ?07/06/2021 2:01:50 PM ?Number of Addenda: 0 ?Note Initiated On: 07/06/2021 1:18 PM ?Total Procedure Duration: 0 hours 36 minutes 48 seconds  ?Estimated Blood Loss:  Estimated blood loss was minimal. ?     [facility] ?

## 2021-07-06 NOTE — H&P (Signed)
Outpatient short stay form Pre-procedure ?07/06/2021  ?Brandi Rubenstein, MD ? ?Primary Physician: Cyndi Bender, PA-C ? ?Reason for visit:  Abnormal imaging ? ?History of present illness:   ? ?66 y/o lady with COPD on 2 liters of O2 here for unsedated flex sig for PET scan showing possible rectal malignancy. History of hysterectomy. No blood thinners. ? ? ? ?Current Facility-Administered Medications:  ?  0.9 %  sodium chloride infusion, , Intravenous, Continuous, Chamar Broughton, Hilton Cork, MD, Last Rate: 20 mL/hr at 07/06/21 1311, 1,000 mL at 07/06/21 1311 ? ?Medications Prior to Admission  ?Medication Sig Dispense Refill Last Dose  ? albuterol (VENTOLIN HFA) 108 (90 Base) MCG/ACT inhaler 2 puffs every 4 (four) hours as needed (wheezing).   07/06/2021 at 1200  ? aspirin 81 MG EC tablet Take 1 tablet by mouth daily.   07/06/2021  ? atorvastatin (LIPITOR) 10 MG tablet Take 1 tablet by mouth daily.   07/06/2021  ? benzonatate (TESSALON) 100 MG capsule Take 1 capsule by mouth 3 (three) times daily as needed.   Past Week  ? clonazePAM (KLONOPIN) 0.5 MG tablet Take 0.5 mg by mouth 2 (two) times daily as needed for anxiety.   Past Week  ? cyanocobalamin 1000 MCG tablet Take 1 tablet by mouth daily.   07/05/2021  ? dicyclomine (BENTYL) 10 MG capsule Take 1 capsule by mouth daily.   07/06/2021  ? fluticasone (FLONASE) 50 MCG/ACT nasal spray Place 1 spray into the nose daily.   Past Week  ? hydrOXYzine (ATARAX) 25 MG tablet Take 25 mg by mouth 3 (three) times daily as needed.   Past Week  ? ibandronate (BONIVA) 150 MG tablet Take 1 tablet by mouth every 30 (thirty) days.   Past Month  ? montelukast (SINGULAIR) 10 MG tablet Take 10 mg by mouth at bedtime.   07/06/2021  ? nebivolol (BYSTOLIC) 5 MG tablet Take 1 tablet by mouth daily.   07/06/2021  ? sertraline (ZOLOFT) 100 MG tablet Take 1 tablet by mouth daily.   07/06/2021  ? theophylline (THEO-24) 300 MG 24 hr capsule Take 300 mg by mouth daily.   07/06/2021  ? tiZANidine (ZANAFLEX) 4 MG capsule  Take 4 mg by mouth daily as needed for muscle spasms.   Past Week  ? tolterodine (DETROL LA) 4 MG 24 hr capsule Take 1 capsule by mouth daily.   07/06/2021  ? TRELEGY ELLIPTA 100-62.5-25 MCG/ACT AEPB    Past Week  ? albuterol (PROVENTIL) (2.5 MG/3ML) 0.083% nebulizer solution Inhale into the lungs every 4 (four) hours as needed.     ? Iron-Vitamins (GERITOL COMPLETE PO) Take 1 tablet by mouth daily.     ? levothyroxine (SYNTHROID) 125 MCG tablet Take 1 tablet by mouth daily.     ? omeprazole (PRILOSEC) 40 MG capsule Take 40 mg by mouth daily.     ? predniSONE (DELTASONE) 10 MG tablet Take 1 tablet by mouth every other day.     ? ? ? ?No Known Allergies ? ? ?Past Medical History:  ?Diagnosis Date  ? Allergic rhinitis due to allergen   ? Atherosclerosis   ? Depression   ? Essential hypertension, benign   ? Graves disease   ? Hypercholesteremia   ? IBS (irritable bowel syndrome)   ? Insomnia   ? Interstitial lung disease ([place])   ? Myalgia   ? Pulmonary nodules   ? Rectal lesion   ? Stage 4 very severe COPD by GOLD classification ([place])   ?  Statin intolerance   ? Urge incontinence of urine   ? ? ?Review of systems:  Otherwise negative.  ? ? ?Physical Exam ? ?Gen: Alert, oriented. Appears stated age.  ?HEENT: PERRLA. ?Lungs: No respiratory distress ?CV: RRR ?Abd: soft, benign, no masses ?Ext: No edema ? ? ? ?Planned procedures: Proceed with flex sig. The patient understands the nature of the planned procedure, indications, risks, alternatives and potential complications including but not limited to bleeding, infection, perforation, damage to internal organs and possible oversedation/side effects from anesthesia. The patient agrees and gives consent to proceed.  ?Please refer to procedure notes for findings, recommendations and patient disposition/instructions.  ? ? ? ?Brandi Rubenstein, MD ?[facility] ? ? ? ?  ? ?

## 2021-07-07 ENCOUNTER — Encounter: Payer: Self-pay | Admitting: Gastroenterology

## 2021-07-07 DIAGNOSIS — J449 Chronic obstructive pulmonary disease, unspecified: Secondary | ICD-10-CM | POA: Diagnosis not present

## 2021-07-08 ENCOUNTER — Telehealth: Payer: Self-pay

## 2021-07-08 LAB — SURGICAL PATHOLOGY

## 2021-07-08 NOTE — Telephone Encounter (Signed)
Call placed to Brandi Schultz regarding appointment with Dr. Valeta Harms. We do not see where she has called to schedule that appointment. Left voicemail with Dr. Juline Patch office number and asked for her to call and schedule. Also asked for call back to let us know when scheduled. Dr. Janese Banks will see her back following this appointment. ?

## 2021-07-08 NOTE — Telephone Encounter (Signed)
Left voicemail for patient to call back to schedule consultation with Dr. Valeta Harms to discuss biopsy options and bronchoscopy for evaluation of lung nodules. Mailed letter to patient's address on file. ?

## 2021-07-09 ENCOUNTER — Telehealth: Payer: Self-pay

## 2021-07-09 NOTE — Telephone Encounter (Signed)
Spoke with Ms. Starleen Blue regarding making appointment to see Dr. Valeta Harms for possible lung biopsy. She states she is still recovering from her flex sigmoidoscopy. Her BP dropped and she is now drinking pedialyte as recommended by her physician. She states she will call in a few days when she feels better. She has been provided the phone number and encouraged to call.  ?

## 2021-07-13 ENCOUNTER — Telehealth: Payer: Self-pay | Admitting: Pulmonary Disease

## 2021-07-14 NOTE — Telephone Encounter (Signed)
Lm for patient.  

## 2021-07-14 NOTE — Telephone Encounter (Signed)
Please refer to 07/01/2021. ? ?Spoke to patient and offered appt with Dr. Valeta Harms. She stated that she would like to hold off on scheduling appt to discuss bx at this time, as she is trying to recover from colonoscopy.  ? ?Routing to Dr. Valeta Harms as an [place] ?

## 2021-07-15 NOTE — Telephone Encounter (Signed)
Noted.  Will close encounter.  

## 2021-07-23 ENCOUNTER — Telehealth: Payer: Self-pay

## 2021-07-23 NOTE — Telephone Encounter (Signed)
Call placed to Brandi Schultz to follow up on how she was feeling and to see if she is now willing to move forward with making appointment for possible lung biopsy. No answer. Left voicemail to return call. ?

## 2021-08-07 DIAGNOSIS — J449 Chronic obstructive pulmonary disease, unspecified: Secondary | ICD-10-CM | POA: Diagnosis not present

## 2021-09-06 DIAGNOSIS — J449 Chronic obstructive pulmonary disease, unspecified: Secondary | ICD-10-CM | POA: Diagnosis not present

## 2021-09-08 ENCOUNTER — Telehealth: Payer: Self-pay

## 2021-09-08 NOTE — Telephone Encounter (Signed)
Brandi Schultz has never returned calls regarding further work up of lung nodules. Will follow up after she sees Dr. Raul Del in pulmonology 09/14/21. ?

## 2021-09-14 DIAGNOSIS — J449 Chronic obstructive pulmonary disease, unspecified: Secondary | ICD-10-CM | POA: Diagnosis not present

## 2021-09-14 DIAGNOSIS — J310 Chronic rhinitis: Secondary | ICD-10-CM | POA: Diagnosis not present

## 2021-09-14 DIAGNOSIS — R0609 Other forms of dyspnea: Secondary | ICD-10-CM | POA: Diagnosis not present

## 2021-09-17 ENCOUNTER — Telehealth: Payer: Self-pay

## 2021-09-17 NOTE — Telephone Encounter (Signed)
Ms. Montavon followed up with pulmonary. "Waxing and waning nodularity, looks more inflammatory/atypical infection. However she is at risk of neoplasm. Left upper stable lesion Lung cancer screening chest ct 1/24"  GI navigator will sign off.

## 2021-09-21 NOTE — Telephone Encounter (Signed)
Noted. Next follow up with pulmonary is scheduled for 8/15.

## 2021-09-23 NOTE — Telephone Encounter (Signed)
Can you let pulmonary to f/u with her in that case. I am not scheduling any follow up with me

## 2021-10-07 DIAGNOSIS — J449 Chronic obstructive pulmonary disease, unspecified: Secondary | ICD-10-CM | POA: Diagnosis not present

## 2021-11-06 DIAGNOSIS — J449 Chronic obstructive pulmonary disease, unspecified: Secondary | ICD-10-CM | POA: Diagnosis not present

## 2021-11-09 DIAGNOSIS — Z0000 Encounter for general adult medical examination without abnormal findings: Secondary | ICD-10-CM | POA: Diagnosis not present

## 2021-11-09 DIAGNOSIS — Z139 Encounter for screening, unspecified: Secondary | ICD-10-CM | POA: Diagnosis not present

## 2021-11-09 DIAGNOSIS — Z1331 Encounter for screening for depression: Secondary | ICD-10-CM | POA: Diagnosis not present

## 2021-11-09 DIAGNOSIS — E785 Hyperlipidemia, unspecified: Secondary | ICD-10-CM | POA: Diagnosis not present

## 2021-11-09 DIAGNOSIS — Z9181 History of falling: Secondary | ICD-10-CM | POA: Diagnosis not present

## 2021-11-23 DIAGNOSIS — I10 Essential (primary) hypertension: Secondary | ICD-10-CM | POA: Diagnosis not present

## 2021-11-23 DIAGNOSIS — E7800 Pure hypercholesterolemia, unspecified: Secondary | ICD-10-CM | POA: Diagnosis not present

## 2021-11-23 DIAGNOSIS — G4700 Insomnia, unspecified: Secondary | ICD-10-CM | POA: Diagnosis not present

## 2021-11-23 DIAGNOSIS — F32A Depression, unspecified: Secondary | ICD-10-CM | POA: Diagnosis not present

## 2021-11-23 DIAGNOSIS — J449 Chronic obstructive pulmonary disease, unspecified: Secondary | ICD-10-CM | POA: Diagnosis not present

## 2021-11-23 DIAGNOSIS — Z79899 Other long term (current) drug therapy: Secondary | ICD-10-CM | POA: Diagnosis not present

## 2021-11-23 DIAGNOSIS — E890 Postprocedural hypothyroidism: Secondary | ICD-10-CM | POA: Diagnosis not present

## 2021-11-23 DIAGNOSIS — F410 Panic disorder [episodic paroxysmal anxiety] without agoraphobia: Secondary | ICD-10-CM | POA: Diagnosis not present

## 2021-11-23 DIAGNOSIS — K219 Gastro-esophageal reflux disease without esophagitis: Secondary | ICD-10-CM | POA: Diagnosis not present

## 2021-12-15 DIAGNOSIS — J439 Emphysema, unspecified: Secondary | ICD-10-CM | POA: Diagnosis not present

## 2021-12-15 DIAGNOSIS — Z9981 Dependence on supplemental oxygen: Secondary | ICD-10-CM | POA: Diagnosis not present

## 2021-12-15 DIAGNOSIS — J449 Chronic obstructive pulmonary disease, unspecified: Secondary | ICD-10-CM | POA: Diagnosis not present

## 2021-12-15 DIAGNOSIS — R911 Solitary pulmonary nodule: Secondary | ICD-10-CM | POA: Diagnosis not present

## 2021-12-15 DIAGNOSIS — R0602 Shortness of breath: Secondary | ICD-10-CM | POA: Diagnosis not present

## 2022-01-02 DIAGNOSIS — Z9981 Dependence on supplemental oxygen: Secondary | ICD-10-CM | POA: Diagnosis not present

## 2022-02-07 IMAGING — CT CT CHEST W/O CM
2 of 4 series · 15 of 36 positions shown, 18 images · non-contrast
Comparison: 07/01/2020

CLINICAL DATA: Shortness of breath, follow-up pulmonary nodularity

EXAM:
CT CHEST WITHOUT CONTRAST
TECHNIQUE: Multidetector CT imaging of the chest was performed following the
standard protocol without IV contrast.

[Series 2: chest 2.00 · axial · 0.60mm/px · z∈[-1135,-841]mm · 12 of 175 slices shown, 15 images]
[im 14/175  mediastinal]
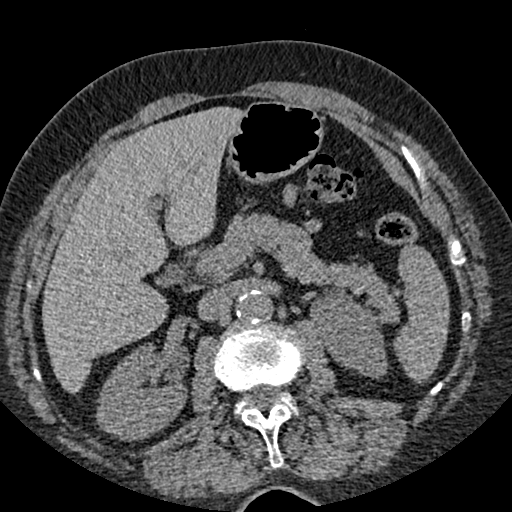
[im 14/175  lung]
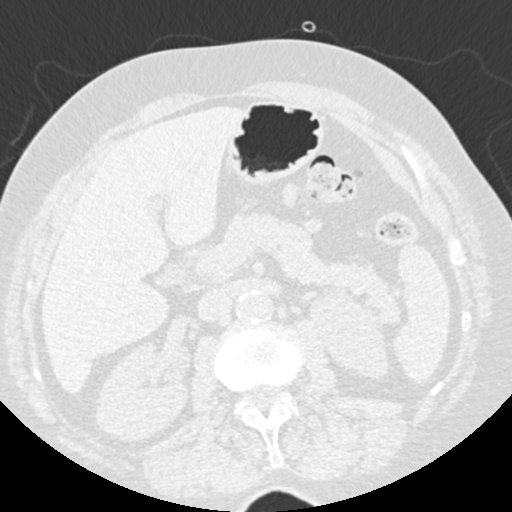
[im 27/175  lung]
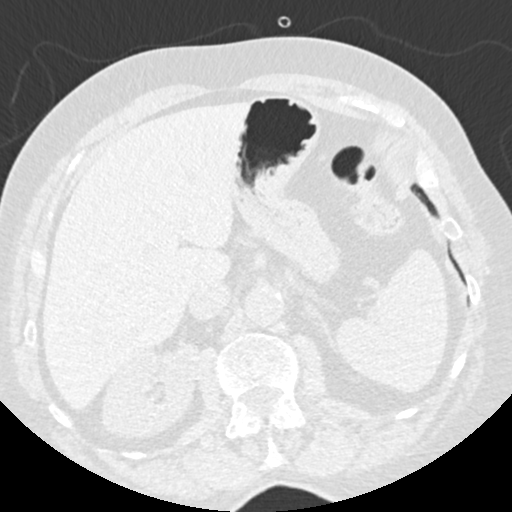
[im 41/175  lung]
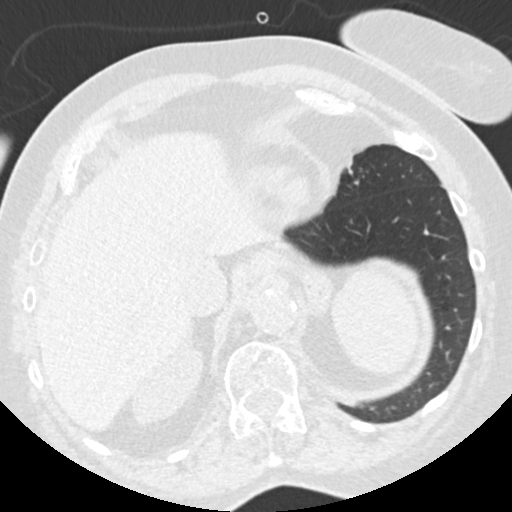
[im 54/175  lung]
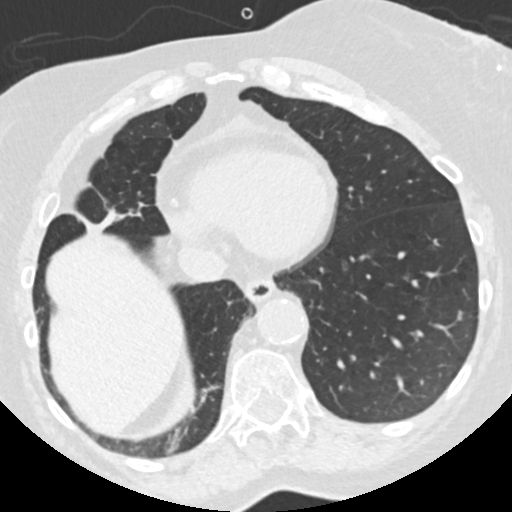
[im 67/175  mediastinal]
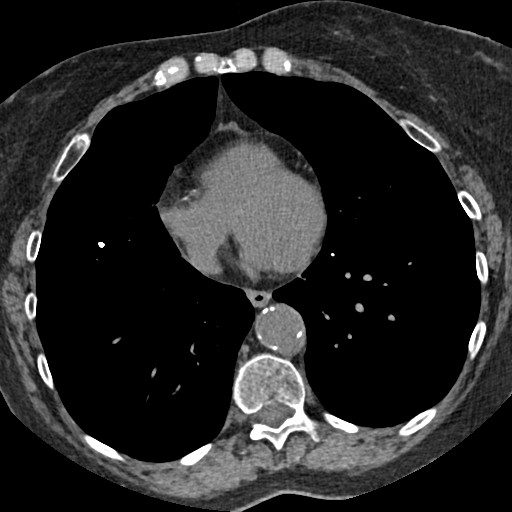
[im 67/175  lung]
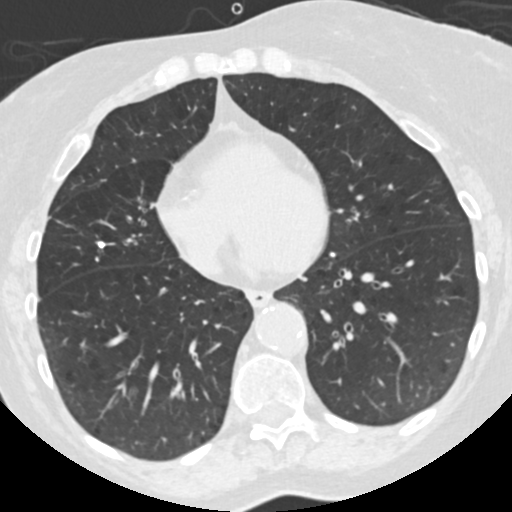
[im 81/175  lung]
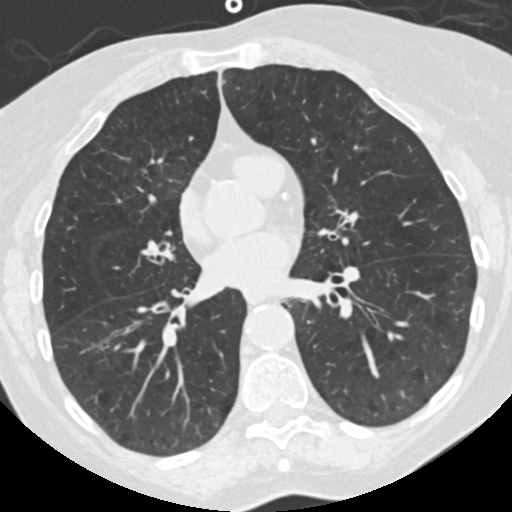
[im 94/175  lung]
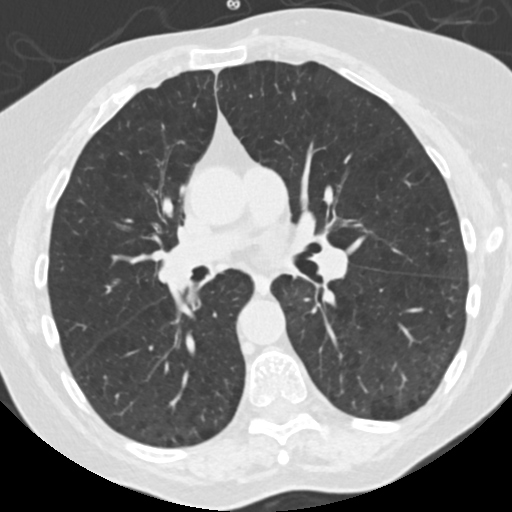
[im 108/175  lung]
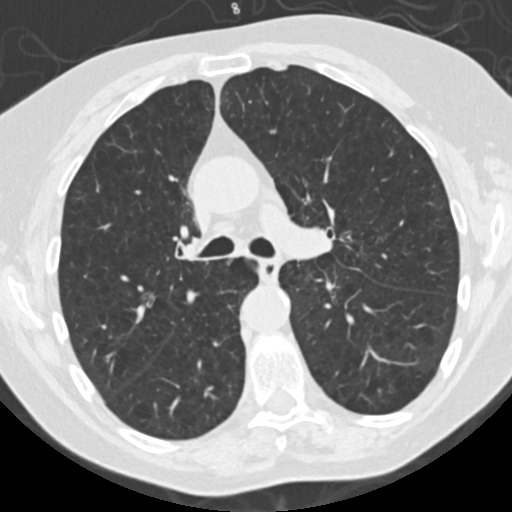
[im 121/175  mediastinal]
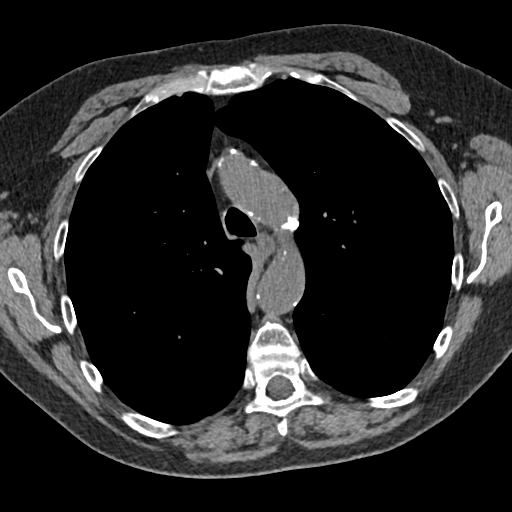
[im 121/175  lung]
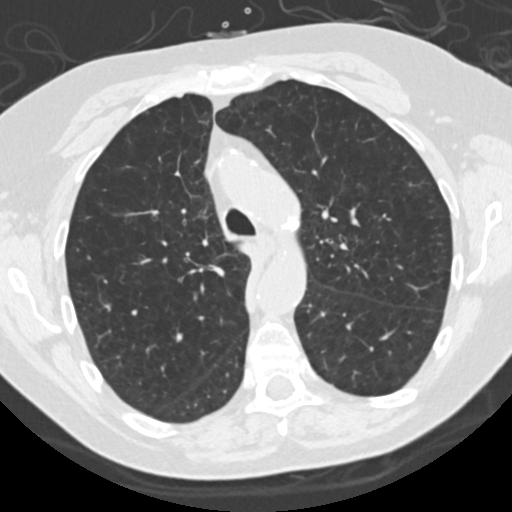
[im 134/175  lung]
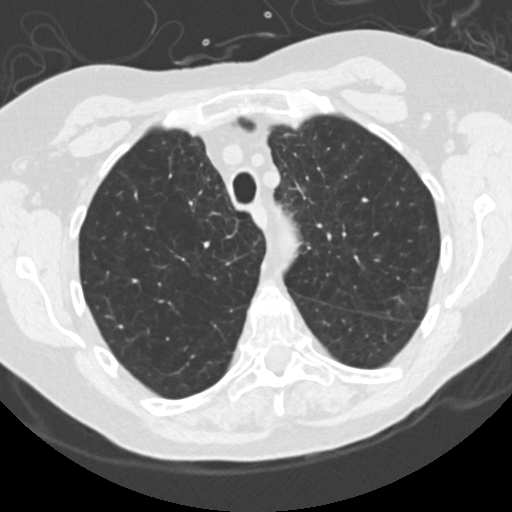
[im 148/175  lung]
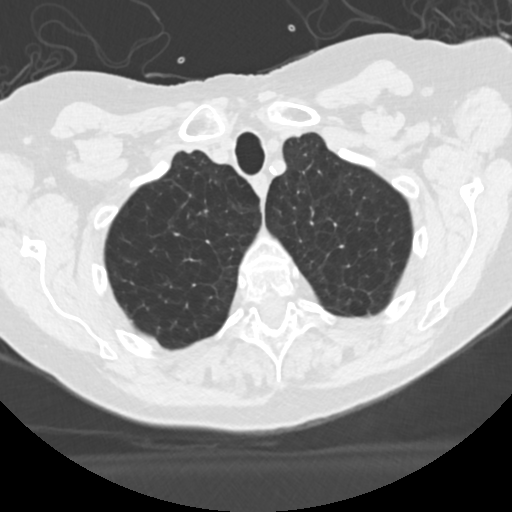
[im 161/175  lung]
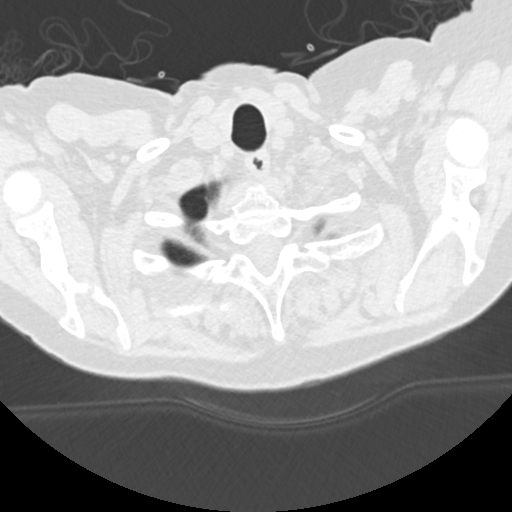

[Series 5: coronals chest 2.00 cor · coronal · 0.60mm/px · 3 of 152 slices shown]
[im 31/152  lung]
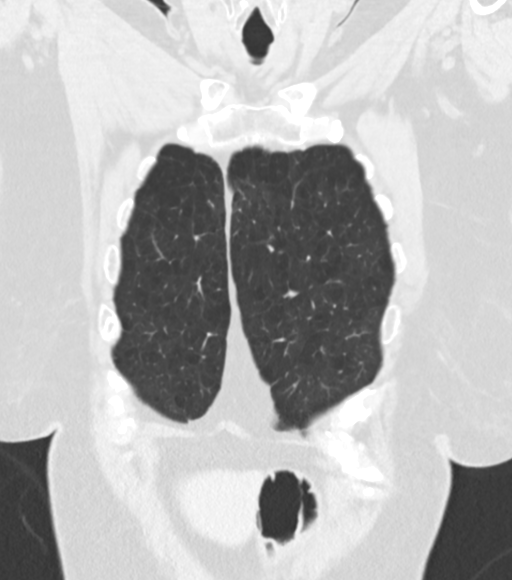
[im 61/152  lung]
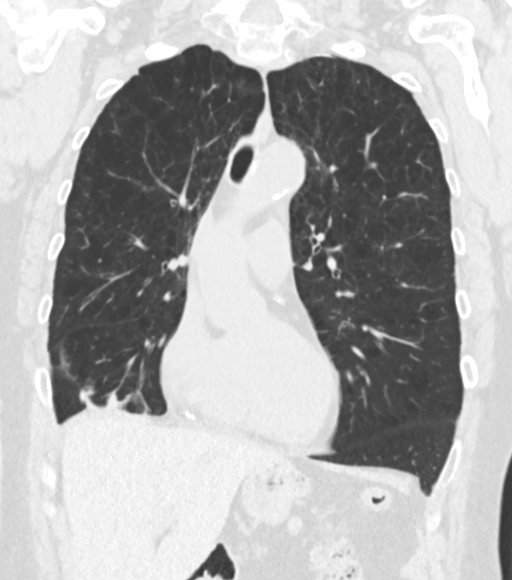
[im 91/152  lung]
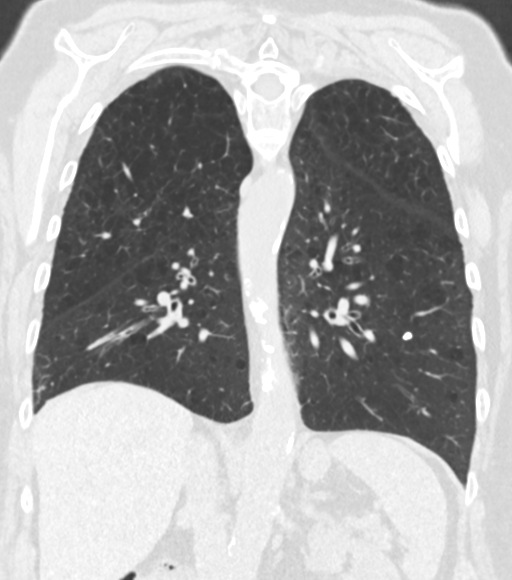

[15 of 36 positions shown; findings below may reference images not displayed]

FINDINGS: Cardiovascular: Aortic atherosclerosis. Normal heart size.
Three-vessel coronary artery calcifications. No pericardial
effusion.

Mediastinum/Nodes: No enlarged mediastinal, hilar, or axillary lymph
nodes. Thyroid gland, trachea, and esophagus demonstrate no
significant findings.

Lungs/Pleura: Severe centrilobular emphysema. Diffuse bilateral
bronchial wall thickening and bronchiolar plugging in the lung
bases. Bandlike scarring in the right lung base and right middle
lobe. Irregular nodular opacity of the posterior left upper lobe
abutting and tenting the major fissure measures 1.9 x 1.1 cm, not
significantly changed compared to prior examination (series 3, image
61). Unchanged irregular nodule of the superior segment right lower
lobe measuring 0.8 cm (series 3, image 89). Additional calcified,
definitively benign small nodules scattered throughout lungs. No
pleural effusion or pneumothorax.

Upper Abdomen: No acute abnormality.

Musculoskeletal: No chest wall mass or suspicious bone lesions
identified.
IMPRESSION: 1. Severe centrilobular emphysema.
2. Diffuse bilateral bronchial wall thickening and bronchiolar
plugging in the lung bases, consistent with nonspecific infectious
or inflammatory bronchitis.
3. Irregular nodular opacity of the posterior left upper lobe
abutting and tenting the major fissure measures 1.9 x 1.1 cm, not
significantly changed compared to prior examination. Interval
stability is reassuring, however malignancy is difficult to strictly
exclude. As previously, consider PET-CT for metabolic
characterization and or continued follow-up to establish long-term
stability.
4. Unchanged irregular nodule of the superior segment right lower
lobe measuring 0.8 cm, most likely infectious or inflammatory.
Attention on follow-up.
5. Coronary artery disease.

Aortic Atherosclerosis ([id number]) and Emphysema ([id number]).

## 2022-05-28 DIAGNOSIS — E890 Postprocedural hypothyroidism: Secondary | ICD-10-CM | POA: Diagnosis not present

## 2022-05-28 DIAGNOSIS — F410 Panic disorder [episodic paroxysmal anxiety] without agoraphobia: Secondary | ICD-10-CM | POA: Diagnosis not present

## 2022-05-28 DIAGNOSIS — I10 Essential (primary) hypertension: Secondary | ICD-10-CM | POA: Diagnosis not present

## 2022-05-28 DIAGNOSIS — F32A Depression, unspecified: Secondary | ICD-10-CM | POA: Diagnosis not present

## 2022-05-28 DIAGNOSIS — K219 Gastro-esophageal reflux disease without esophagitis: Secondary | ICD-10-CM | POA: Diagnosis not present

## 2022-05-28 DIAGNOSIS — Z79899 Other long term (current) drug therapy: Secondary | ICD-10-CM | POA: Diagnosis not present

## 2022-05-28 DIAGNOSIS — E7800 Pure hypercholesterolemia, unspecified: Secondary | ICD-10-CM | POA: Diagnosis not present

## 2022-05-28 DIAGNOSIS — J449 Chronic obstructive pulmonary disease, unspecified: Secondary | ICD-10-CM | POA: Diagnosis not present

## 2022-05-28 DIAGNOSIS — G4700 Insomnia, unspecified: Secondary | ICD-10-CM | POA: Diagnosis not present

## 2022-07-22 DIAGNOSIS — F17210 Nicotine dependence, cigarettes, uncomplicated: Secondary | ICD-10-CM | POA: Diagnosis not present

## 2022-07-22 DIAGNOSIS — Z9981 Dependence on supplemental oxygen: Secondary | ICD-10-CM | POA: Diagnosis not present

## 2022-07-22 DIAGNOSIS — R0602 Shortness of breath: Secondary | ICD-10-CM | POA: Diagnosis not present

## 2022-07-22 DIAGNOSIS — R9389 Abnormal findings on diagnostic imaging of other specified body structures: Secondary | ICD-10-CM | POA: Diagnosis not present

## 2022-07-22 DIAGNOSIS — J449 Chronic obstructive pulmonary disease, unspecified: Secondary | ICD-10-CM | POA: Diagnosis not present

## 2022-10-21 DIAGNOSIS — J441 Chronic obstructive pulmonary disease with (acute) exacerbation: Secondary | ICD-10-CM | POA: Diagnosis not present

## 2022-10-21 DIAGNOSIS — F17211 Nicotine dependence, cigarettes, in remission: Secondary | ICD-10-CM | POA: Diagnosis not present

## 2022-10-21 DIAGNOSIS — J449 Chronic obstructive pulmonary disease, unspecified: Secondary | ICD-10-CM | POA: Diagnosis not present

## 2022-10-21 DIAGNOSIS — R0602 Shortness of breath: Secondary | ICD-10-CM | POA: Diagnosis not present

## 2022-10-21 DIAGNOSIS — N6320 Unspecified lump in the left breast, unspecified quadrant: Secondary | ICD-10-CM | POA: Diagnosis not present

## 2022-11-24 DIAGNOSIS — R92323 Mammographic fibroglandular density, bilateral breasts: Secondary | ICD-10-CM | POA: Diagnosis not present

## 2022-11-24 DIAGNOSIS — N6321 Unspecified lump in the left breast, upper outer quadrant: Secondary | ICD-10-CM | POA: Diagnosis not present

## 2022-11-26 DIAGNOSIS — Z9181 History of falling: Secondary | ICD-10-CM | POA: Diagnosis not present

## 2022-11-26 DIAGNOSIS — F410 Panic disorder [episodic paroxysmal anxiety] without agoraphobia: Secondary | ICD-10-CM | POA: Diagnosis not present

## 2022-11-26 DIAGNOSIS — J449 Chronic obstructive pulmonary disease, unspecified: Secondary | ICD-10-CM | POA: Diagnosis not present

## 2022-11-26 DIAGNOSIS — E7800 Pure hypercholesterolemia, unspecified: Secondary | ICD-10-CM | POA: Diagnosis not present

## 2022-11-26 DIAGNOSIS — I10 Essential (primary) hypertension: Secondary | ICD-10-CM | POA: Diagnosis not present

## 2022-11-26 DIAGNOSIS — G4700 Insomnia, unspecified: Secondary | ICD-10-CM | POA: Diagnosis not present

## 2022-11-26 DIAGNOSIS — F32A Depression, unspecified: Secondary | ICD-10-CM | POA: Diagnosis not present

## 2022-11-26 DIAGNOSIS — E890 Postprocedural hypothyroidism: Secondary | ICD-10-CM | POA: Diagnosis not present

## 2022-11-26 DIAGNOSIS — Z79899 Other long term (current) drug therapy: Secondary | ICD-10-CM | POA: Diagnosis not present

## 2022-12-08 DIAGNOSIS — C50812 Malignant neoplasm of overlapping sites of left female breast: Secondary | ICD-10-CM | POA: Diagnosis not present

## 2022-12-08 DIAGNOSIS — C50412 Malignant neoplasm of upper-outer quadrant of left female breast: Secondary | ICD-10-CM | POA: Diagnosis not present

## 2022-12-08 DIAGNOSIS — R928 Other abnormal and inconclusive findings on diagnostic imaging of breast: Secondary | ICD-10-CM | POA: Diagnosis not present

## 2022-12-08 DIAGNOSIS — N6321 Unspecified lump in the left breast, upper outer quadrant: Secondary | ICD-10-CM | POA: Diagnosis not present

## 2022-12-08 DIAGNOSIS — N6325 Unspecified lump in the left breast, overlapping quadrants: Secondary | ICD-10-CM | POA: Diagnosis not present

## 2022-12-10 NOTE — Progress Notes (Signed)
Initial phone contact with newly diagnosed pt. Pt relies on her brother for transportation so she would like to talk with him and will call back on Monday to discuss the best time to schedule a surgical consult. Navigation contact info given.

## 2022-12-13 NOTE — Progress Notes (Signed)
Phone contact with pt. Pt scheduled with Dr. Georgiana Shore for 12/27/22 at 1:30. Encouraged pt to stop by the [facility] and pick up a copy of "The Breast Cancer Treatment Handbook"the patient to contact navigator with questions or concerns.

## 2022-12-27 DIAGNOSIS — Z170 Estrogen receptor positive status [ER+]: Secondary | ICD-10-CM | POA: Diagnosis not present

## 2022-12-27 DIAGNOSIS — C50412 Malignant neoplasm of upper-outer quadrant of left female breast: Secondary | ICD-10-CM | POA: Diagnosis not present

## 2022-12-27 DIAGNOSIS — C50312 Malignant neoplasm of lower-inner quadrant of left female breast: Secondary | ICD-10-CM | POA: Diagnosis not present

## 2023-01-06 ENCOUNTER — Encounter: Payer: Self-pay | Admitting: Oncology

## 2023-01-06 ENCOUNTER — Inpatient Hospital Stay: Payer: Medicare HMO | Attending: Oncology | Admitting: Oncology

## 2023-01-06 ENCOUNTER — Inpatient Hospital Stay: Payer: Medicare HMO

## 2023-01-06 VITALS — BP 114/60 | HR 119 | Temp 98.0°F | Resp 16 | Ht 65.0 in | Wt 129.5 lb

## 2023-01-06 DIAGNOSIS — C50412 Malignant neoplasm of upper-outer quadrant of left female breast: Secondary | ICD-10-CM

## 2023-01-06 DIAGNOSIS — Z170 Estrogen receptor positive status [ER+]: Secondary | ICD-10-CM

## 2023-01-06 DIAGNOSIS — R935 Abnormal findings on diagnostic imaging of other abdominal regions, including retroperitoneum: Secondary | ICD-10-CM | POA: Diagnosis not present

## 2023-01-06 NOTE — Progress Notes (Signed)
[facility]  [address] [place],  [place]  [address] [phone]  Clinic Day:  01/06/2023  Referring physician: Lonie Peak, PA-C  HISTORY OF PRESENT ILLNESS:  The patient is a 67 y.o. female who I was asked to consult upon for newly diagnosed breast cancer.  Her history dates back approximately 1 month ago when she first felt a knot on her left breast.  A few weeks later, she recalls falling, with her chest hitting against the wall.  Shortly after this, she recalls her left knot rapidly enlarging in size.  Furthermore, she could see that some of her mass was starting to break through her skin.  Based upon this, she got in contact with her primary care office, who ordered a diagnostic mammogram immediately.  This study revealed a 6.5 cm solid/cystic mass in the upper outer quadrant of her left breast.  Furthermore, a suspicious left axillary lymph node was appreciated.  Upon undergoing her breast biopsy, her ultrasound revealed another small mass in the upper inner quadrant of her left breast.  This led to this lesion, as well as her large lesion, being biopsied for further evaluation.  Of note, due to the depth of her suspicious axillary lymph nodes and its proximity to larger blood vessels, an axillary lymph node biopsy was not done.  The pathology from her biopsies revealed that the large mass was consistent with grade 3 invasive ductal carcinoma.  It was estrogen receptor positive, but progesterone and HER2/neu receptor negative.  Her KI-67 score was very elevated at 70%.   Her smaller, medial breast mass came back consistent with grade 2 invasive ductal carcinoma that was estrogen and progesterone receptor positive, but HER2/neu receptor negative.  Her KI-67 was low at 10%.  The patient comes in today to go over all of her breast cancer pathology and its implications.  Of note, both her mother and maternal aunt also had breast cancer.  It is worth mentioning  that this patient did have a PET scan in January 2023 for which a rectal mass was appreciated.  The patient claims that a flex sigmoidoscopy followed shortly thereafter, for which for which a 2 cm polyp was seen and removed.  There was no evidence of any malignancy being present in her colorectal region.  This patient's PET scan also showed hypermetabolic pulmonary nodules.  However, due to her suboptimal lung health, the patient has never undergone any type of biopsy to verify what these nodules represent.  Of note, her PET scan in January 2023 showed no evidence of an abnormal lesion being in her left breast.  Per hospital records, this patient had not undergone a mammogram since August 2021.  PAST MEDICAL HISTORY:   Past Medical History:  Diagnosis Date   Allergic rhinitis due to allergen    Atherosclerosis    Depression    Essential hypertension, benign    Graves disease    Hypercholesteremia    Hyperlipidemia    IBS (irritable bowel syndrome)    Insomnia    Interstitial lung disease (HCC)    Myalgia    Pulmonary nodules    Rectal lesion    Stage 4 very severe COPD by GOLD classification (HCC)    Statin intolerance    Urge incontinence of urine     PAST SURGICAL HISTORY:   Past Surgical History:  Procedure Laterality Date   COLONOSCOPY W/ POLYPECTOMY     FLEXIBLE SIGMOIDOSCOPY N/A 07/06/2021   Procedure: FLEXIBLE SIGMOIDOSCOPY;  Surgeon: Regis Bill, MD;  Location: [facility] ENDOSCOPY;  Service: Endoscopy;  Laterality: N/A;   TUBAL LIGATION     VAGINAL HYSTERECTOMY      CURRENT MEDICATIONS:   Current Outpatient Medications  Medication Sig Dispense Refill   albuterol (PROVENTIL) (2.5 MG/3ML) 0.083% nebulizer solution Inhale into the lungs every 4 (four) hours as needed.     albuterol (VENTOLIN HFA) 108 (90 Base) MCG/ACT inhaler 2 puffs every 4 (four) hours as needed (wheezing).     aspirin 81 MG EC tablet Take 1 tablet by mouth daily.     atorvastatin (LIPITOR) 10 MG  tablet Take 1 tablet by mouth daily.     benzonatate (TESSALON) 100 MG capsule Take 1 capsule by mouth 3 (three) times daily as needed.     clonazePAM (KLONOPIN) 0.5 MG tablet Take 0.5 mg by mouth 2 (two) times daily as needed for anxiety.     cyanocobalamin 1000 MCG tablet Take 1 tablet by mouth daily.     dicyclomine (BENTYL) 10 MG capsule Take 1 capsule by mouth daily.     fluticasone (FLONASE) 50 MCG/ACT nasal spray Place 1 spray into the nose daily.     hydrOXYzine (ATARAX) 25 MG tablet Take 25 mg by mouth 3 (three) times daily as needed.     ibandronate (BONIVA) 150 MG tablet Take 1 tablet by mouth every 30 (thirty) days.     Iron-Vitamins (GERITOL COMPLETE PO) Take 1 tablet by mouth daily.     levothyroxine (SYNTHROID) 125 MCG tablet Take 1 tablet by mouth daily.     montelukast (SINGULAIR) 10 MG tablet Take 10 mg by mouth at bedtime.     nebivolol (BYSTOLIC) 5 MG tablet Take 1 tablet by mouth daily.     omeprazole (PRILOSEC) 40 MG capsule Take 40 mg by mouth daily.     predniSONE (DELTASONE) 10 MG tablet Take 1 tablet by mouth every other day.     sertraline (ZOLOFT) 100 MG tablet Take 1 tablet by mouth daily.     theophylline (THEO-24) 300 MG 24 hr capsule Take 300 mg by mouth daily.     tiZANidine (ZANAFLEX) 4 MG capsule Take 4 mg by mouth daily as needed for muscle spasms.     tolterodine (DETROL LA) 4 MG 24 hr capsule Take 1 capsule by mouth daily.     TRELEGY ELLIPTA 100-62.5-25 MCG/ACT AEPB      No current facility-administered medications for this visit.    ALLERGIES:  No Known Allergies  FAMILY HISTORY:   Family History  Problem Relation Age of Onset   Breast cancer Mother    Bone cancer Mother    Heart disease Mother    Heart disease Father    Heart attack Father    Heart disease Sister    Diabetes Brother    Colon cancer Maternal Grandmother    Breast cancer Maternal Aunt    Throat cancer Maternal Aunt     SOCIAL HISTORY:  The patient was born and raised  in [place].  She currently lives in [place] with her sister.  She is divorced, with 3 children and 5 grandchildren.  One of her children died in childbirth.  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 She has smoked up to 2 packs of cigarettes daily for the past 55 years.  There is no history of alcohol use.  REVIEW OF SYSTEMS:  Review of Systems  Constitutional:  Positive for unexpected weight change. Negative for fatigue and fever.  HENT:   Negative for hearing loss and sore throat.   Eyes:  Positive for eye problems.  Respiratory:  Positive for cough and shortness of breath. Negative for chest tightness and hemoptysis.   Cardiovascular:  Negative for chest pain and palpitations.  Gastrointestinal:  Positive for constipation. Negative for abdominal distention, abdominal pain, blood in stool, diarrhea, nausea and vomiting.  Endocrine: Negative for hot flashes.  Genitourinary:  Negative for difficulty urinating, dysuria, frequency, hematuria and nocturia.   Musculoskeletal:  Negative for arthralgias, back pain, gait problem and myalgias.  Skin: Negative.  Negative for itching and rash.  Neurological: Negative.  Negative for dizziness, extremity weakness, gait problem, headaches, light-headedness and numbness.  Hematological: Negative.   Psychiatric/Behavioral: Negative.  Negative for depression and suicidal ideas. The patient is not nervous/anxious.    PHYSICAL EXAM:  Blood pressure 114/60, pulse (!) 119, temperature 98 F (36.7 C), resp. rate 16, height 5\' 5"  (1.651 m), weight 129 lb 8 oz (58.7 kg), SpO2 95%. Wt Readings from Last 3 Encounters:  01/06/23 129 lb 8 oz (58.7 kg)  06/22/21 156 lb 11.2 oz (71.1 kg)  05/26/21 197 lb (89.4 kg)   Body mass index is 21.55 kg/m. Performance status (ECOG): 2 - Symptomatic, <50% confined to bed Physical Exam Constitutional:      Appearance: Normal appearance. She is  ill-appearing.     Comments: A chronically ill-appearing woman in a wheelchair who is wearing oxygen per nasal cannula.  HENT:     Mouth/Throat:     Pharynx: Oropharynx is clear. No oropharyngeal exudate.  Cardiovascular:     Rate and Rhythm: Normal rate and regular rhythm.     Heart sounds: No murmur heard.    No friction rub. No gallop.  Pulmonary:     Breath sounds: Normal breath sounds.  Chest:  Breasts:    Right: No swelling, bleeding, inverted nipple, mass, nipple discharge or skin change.     Left: Swelling and mass present. No bleeding, inverted nipple, nipple discharge or skin change.     Comments: Her left breast has a peau d'orange appearance, with a mass measuring 9 cm.  A 5 cm focus of this mass has broken through her skin. Abdominal:     General: Bowel sounds are normal. There is no distension.     Palpations: Abdomen is soft. There is no mass.     Tenderness: There is no abdominal tenderness.  Musculoskeletal:        General: No tenderness.     Cervical back: Normal range of motion and neck supple.     Right lower leg: No edema.     Left lower leg: No edema.  Lymphadenopathy:     Cervical: No cervical adenopathy.     Right cervical: No superficial, deep or posterior cervical adenopathy.    Left cervical: No superficial, deep or posterior cervical adenopathy.     Upper Body:     Right upper body: No supraclavicular or axillary adenopathy.     Left upper body: No supraclavicular or axillary adenopathy.     Lower Body: No right inguinal adenopathy. No left inguinal adenopathy.  Skin:    Coloration: Skin is not jaundiced.     Findings: No lesion or rash.  Neurological:     General: No focal deficit present.     Mental Status: She is alert and oriented to person, place, and time. Mental status is  at baseline.  Psychiatric:        Mood and Affect: Mood normal.        Behavior: Behavior normal.        Thought Content: Thought content normal.        Judgment:  Judgment normal.    ASSESSMENT & PLAN:  A 67 y.o. 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  In clinic today, I went over all of her breast cancer pathology with her, for which she understands that the larger breast mass has particularly concerning pathology.  Based upon her likely having inflammatory breast cancer, I definitely believe scans need to be done to ensure there is no evidence of occult disease metastasis.  I will repeat a PET scan in 2 weeks to determine this.  As mentioned previously, a PET scan in January 2023 showed small, but suspicious and hypermetabolic, lung nodules.  I will be curious to see if these nodules have grown over time, as well as if new lesions have developed in less than 2 years, which may be related to her breast cancer.  Her PET scan will be scheduled for September 19th.  I will see her back 1 day later to go over the images and their implications..The patient understands all the plans discussed today and is in agreement with them.  I do appreciate Lonie Peak, PA-C for his new consult.   Zackerie Sara Kirby Funk, MD

## 2023-01-07 DIAGNOSIS — C50919 Malignant neoplasm of unspecified site of unspecified female breast: Secondary | ICD-10-CM | POA: Insufficient documentation

## 2023-01-10 ENCOUNTER — Other Ambulatory Visit: Payer: Self-pay | Admitting: Oncology

## 2023-01-10 ENCOUNTER — Other Ambulatory Visit: Payer: Self-pay

## 2023-01-10 DIAGNOSIS — Z170 Estrogen receptor positive status [ER+]: Secondary | ICD-10-CM

## 2023-01-12 ENCOUNTER — Telehealth: Payer: Self-pay

## 2023-01-12 NOTE — Telephone Encounter (Signed)
[facility] Clinical Social Work  Clinical Social Work was referred by new patient protocol for assessment of psychosocial needs.  Clinical Social Worker attempted to contact patient by phone to offer support and assess for needs. CSW unable to reach or leave vm. Will attempt again.     Marguerita Merles, LCSWA Clinical Social Worker [facility]

## 2023-01-19 ENCOUNTER — Inpatient Hospital Stay: Payer: Medicare HMO

## 2023-01-19 DIAGNOSIS — R918 Other nonspecific abnormal finding of lung field: Secondary | ICD-10-CM | POA: Diagnosis not present

## 2023-01-19 DIAGNOSIS — Z170 Estrogen receptor positive status [ER+]: Secondary | ICD-10-CM | POA: Diagnosis not present

## 2023-01-19 DIAGNOSIS — C50412 Malignant neoplasm of upper-outer quadrant of left female breast: Secondary | ICD-10-CM | POA: Diagnosis not present

## 2023-01-19 NOTE — Progress Notes (Signed)
[facility] Clinical Social Work  Initial Assessment   Brandi Schultz is a 67 y.o. year old female contacted by phone. Clinical Social Work was referred by medical provider for assessment of psychosocial needs.   SDOH (Social Determinants of Health) assessments performed: No   SDOH Screenings   Food Insecurity: No Food Insecurity (01/06/2023)  Housing: Low Risk  (01/06/2023)  Transportation Needs: No Transportation Needs (01/06/2023)  Recent Concern: Transportation Needs - Unmet Transportation Needs (10/21/2022)   [facility] from [facility], [facility]  Utilities: Not At Risk (01/06/2023)  Depression (PHQ2-9): Low Risk  (01/06/2023)  Financial Resource Strain: High Risk (10/21/2022)   Received from [facility], [facility]  Tobacco Use: High Risk (01/06/2023)     Distress Screen completed: No     No data to display            Family/Social Information:  Housing Arrangement: patient lives with her sister. Family members/support persons in your life? Family, Friends, and [employer] concerns: Patient states that her children or sister will assist with getting her to her medical appointments.  Employment: Retired patient is retired from Research scientist (physical sciences).  Income source: Actor concerns: No Type of concern: None Food access concerns: no Religious or spiritual practice: Yes-Patient reports that she believes in God and talks to him everyday, connects through [facility] Currently in place:  Family, Community education officer, English as a second language teacher, Access to [employer], ability to pay bills.  Coping/ Adjustment to diagnosis: Patient understands treatment plan and what happens next? Patient understands that she receives a PET scan and understands that treatment has not been determined. Concerns about diagnosis and/or treatment:  Patient is worried about what treatment may look like.  Patient reported  stressors: Adjusting to my illness Patient reported previous hobbies of gardening and enjoys being outside. Hobbies have changed with progression of health conditions. Current coping skills/ strengths: Active sense of humor , Average or above average intelligence , General fund of knowledge , Religious Affiliation , Special hobby/interest , and Supportive family/friends     SUMMARY: Current SDOH Barriers:  Transportation, financial concerns, and ADLS may become a concern.  Clinical Social Work Clinical Goal(s):  No clinical social work goals at this time  Interventions: Discussed common feeling and emotions when being diagnosed with cancer, and the importance of support during treatment Informed patient of the support team roles and support services at [facility] Provided CSW contact information and encouraged patient to call with any questions or concerns   Follow Up Plan: CSW will follow-up with patient by phone  Patient verbalizes understanding of plan: Yes    Marguerita Merles, LCSWA Clinical Social Worker [facility]  Patient is participating in a Managed Medicaid Plan:  Yes

## 2023-01-20 ENCOUNTER — Inpatient Hospital Stay (INDEPENDENT_AMBULATORY_CARE_PROVIDER_SITE_OTHER): Payer: Medicare HMO | Admitting: Oncology

## 2023-01-20 ENCOUNTER — Encounter: Payer: Self-pay | Admitting: Oncology

## 2023-01-20 VITALS — BP 123/79 | HR 114 | Temp 98.1°F | Resp 18 | Ht 65.0 in | Wt 125.9 lb

## 2023-01-20 DIAGNOSIS — C50412 Malignant neoplasm of upper-outer quadrant of left female breast: Secondary | ICD-10-CM

## 2023-01-20 DIAGNOSIS — Z170 Estrogen receptor positive status [ER+]: Secondary | ICD-10-CM

## 2023-01-20 NOTE — Progress Notes (Signed)
[facility]  [address] [place],  [place]  [address] [phone]  Clinic Day:  01/20/2023  Referring physician: Lonie Peak, PA-C  HISTORY OF PRESENT ILLNESS:  The patient is a 67 y.o. 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  Due to its aggressive nature and propensity to metastasize, a PET scan was done to ensure there was no evidence of early disease metastasis already.  She comes in today to go over her PET scan images and their implications.  Overall, the patient is doing okay.  She still has fairly significant drainage from her left breast lesion.  She denies having symptoms or findings elsewhere which concern her for overt metastatic disease already being present.  PHYSICAL EXAM:  Blood pressure 123/79, pulse (!) 114, temperature 98.1 F (36.7 C), resp. rate 18, height 5\' 5"  (1.651 m), weight 125 lb 14.4 oz (57.1 kg), SpO2 94%. Wt Readings from Last 3 Encounters:  01/20/23 125 lb 14.4 oz (57.1 kg)  01/06/23 129 lb 8 oz (58.7 kg)  06/22/21 156 lb 11.2 oz (71.1 kg)   Body mass index is 20.95 kg/m. Performance status (ECOG): 2 - Symptomatic, <50% confined to bed Physical Exam Constitutional:      Appearance: Normal appearance. She is ill-appearing.     Comments: A chronically ill-appearing woman in a wheelchair who is wearing oxygen per nasal cannula.  HENT:     Mouth/Throat:     Pharynx: Oropharynx is clear. No oropharyngeal exudate.  Cardiovascular:     Rate and Rhythm: Normal rate and regular rhythm.     Heart sounds: No murmur heard.    No friction rub. No gallop.  Pulmonary:     Breath sounds: Normal breath sounds.  Chest:  Breasts:    Right: No swelling, bleeding, inverted nipple, mass, nipple discharge or skin change.     Left: Swelling and mass present. No bleeding, inverted nipple, nipple discharge or skin change.     Comments: Her left breast has a peau  d'orange appearance, with a mass measuring 9 cm.  A 5 cm focus of this mass has broken through her skin. Abdominal:     General: Bowel sounds are normal. There is no distension.     Palpations: Abdomen is soft. There is no mass.     Tenderness: There is no abdominal tenderness.  Musculoskeletal:        General: No tenderness.     Cervical back: Normal range of motion and neck supple.     Right lower leg: No edema.     Left lower leg: No edema.  Lymphadenopathy:     Cervical: No cervical adenopathy.     Right cervical: No superficial, deep or posterior cervical adenopathy.    Left cervical: No superficial, deep or posterior cervical adenopathy.     Upper Body:     Right upper body: No supraclavicular or axillary adenopathy.     Left upper body: No supraclavicular or axillary adenopathy.     Lower Body: No right inguinal adenopathy. No left inguinal adenopathy.  Skin:    Coloration: Skin is not jaundiced.     Findings: No lesion or rash.  Neurological:     General: No focal deficit present.     Mental Status: She is alert and oriented to person, place, and time. Mental status is at baseline.  Psychiatric:        Mood and Affect: Mood  normal.        Behavior: Behavior normal.        Thought Content: Thought content normal.        Judgment: Judgment normal.   SCANS: Her PET scan revealed the following: FINDINGS: Mediastinal blood pool activity: SUV max 1.5  Liver activity: SUV max NA  NECK: No areas of abnormal hypermetabolism.  Incidental CT findings: Dense bilateral carotid atherosclerosis. No cervical adenopathy.  CHEST: No axillary, mediastinal, or hilar nodal hypermetabolism.  Posterior left upper lobe spiculated pulmonary nodule measures 1.0 cm and a S.U.V. max of 1.6 on 83/4. This was similar in size and measured a S.U.V. max of 2.4 on the prior exam (when remeasured).  The left apical linear density is less apparent today.  Posterior right upper lobe spiculated  nodule measures 1.0 cm and a S.U.V. max of 1.1 on 84/4 versus 1.3 cm and a S.U.V. max of 3.3 on the prior.  There is new material filling the right middle lobe and right lower lobe bronchi. New right middle lobe collapse/consolidation centrally with hypermetabolism, including at a S.U.V. max of 4.3 on 115/4.  New right lower lobe peripheral airspace disease laterally.  Incidental CT findings: Centrilobular emphysema. Aortic and coronary artery calcification. New small pericardial effusion.  ABDOMEN/PELVIS: No abdominopelvic nodal hypermetabolism. The previously described rectal hypermetabolism and soft tissue fullness are longer identified.  There is a focus of hypermetabolism within the posterior distal transverse colon which corresponds to possible soft tissue density. Example 8 mm and a S.U.V. max of 6.5 on 162/4.  Incidental CT findings: Abdominal aortic atherosclerosis. Normal adrenal glands. Pelvic floor laxity. Probable hysterectomy.  SKELETON: Development of a lateral left breast hypermetabolic mass. Example 7.0 x 6.4 cm and a S.U.V. max of 34.9 x 105/4. Overlying breast skin thickening and edema suggesting underlying inflammatory component.  No abnormal marrow activity.  Incidental CT findings: Osteopenia.  IMPRESSION: 1. Interval development of a dominant lateral left breast hypermetabolic mass consistent with carcinoma. Suspect an inflammatory component as evidenced by skin thickening and diffuse breast edema. 2. No typical findings of hypermetabolic metastatic disease. 3. Since 05/26/2021, similar and decreased size of spiculated pulmonary nodules which demonstrate low-level hypermetabolism. Presuming no interval chemotherapy, relative stability over nearly 2 years strongly favors a benign etiology. 4. New material within the right middle/right lower lobe endobronchial tree with right middle lobe collapse/consolidation and right lower lobe airspace disease.  Favor sequelae of aspiration. Consider further evaluation with bronchoscopy with special attention to the right middle lobe bronchus to exclude a less likely underlying neoplastic mass. 5. The suspicious findings in the rectum on prior PET are no longer identified.. There is hypermetabolism and possible soft tissue density within the distal transverse colon for which polyp is a concern. If not up-to-date, consider colonoscopy 6. Incidental findings, including: New small pericardial effusion. Aortic atherosclerosis (ICD10-I70.0), coronary artery atherosclerosis and emphysema (ICD10-J43.9).  ASSESSMENT & PLAN:  A 67 y.o. 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  In clinic today, I went over all of her PET scan images, for which she could see there was no evidence of metastatic disease.  Moving forward, all therapy to be given to her will be with the ultimate goal of curing her of her inflammatory breast cancer.  I will start this patient on dose dense chemotherapy, which will consist of Adriamycin/Cytoxan every 2 weeks x 4 cycles, followed by dose dense paclitaxel, which will also be given every 2 weeks x 4  cycles.  A port will need to be placed through which all of her IV chemotherapy will be given.  The patient was made aware of the side effects which can go along with his chemotherapy regimen, including hair loss, nausea, peripheral neuropathy, fatigue, and cytopenias.  The plan is for her first cycle of dose dense chemotherapy to commence on Tuesday, October 1st.  I will see her 2 weeks later before she heads into her second cycle of dose dense Adriamycin/Cytoxan.  The patient understands all plans discussed today and knows to contact office before her next visit if she runs into any problems that require immediate clinical attention.   Brenon Antosh Kirby Funk, MD

## 2023-01-20 NOTE — Progress Notes (Signed)
START ON PATHWAY REGIMEN - Breast     Cycles 1 through 4 = every 14 days:     Doxorubicin      Cyclophosphamide      Pegfilgrastim-xxxx    Cycles 5 through 8 = every 14 days:     Paclitaxel      Pegfilgrastim-xxxx   **Always confirm dose/schedule in your pharmacy ordering system**  Patient Characteristics: Preoperative or Nonsurgical Candidate, M0 (Clinical Staging), cT4d (IBC), Any N, M0, Neoadjuvant Therapy Followed by Surgery, HER2 Negative, ER Positive Therapeutic Status: Preoperative or Nonsurgical Candidate, M0 (Clinical Staging) AJCC M Category: cM0 AJCC Grade: G3 ER Status: Positive (+) AJCC 8 Stage Grouping: IIIC HER2 Status: Negative (-) AJCC T Category: cT4d AJCC N Category: cN1 PR Status: Negative (-) Intent of Therapy: Curative Intent, Discussed with Patient

## 2023-01-21 ENCOUNTER — Telehealth: Payer: Self-pay | Admitting: Oncology

## 2023-01-21 ENCOUNTER — Other Ambulatory Visit: Payer: Self-pay | Admitting: Oncology

## 2023-01-21 ENCOUNTER — Encounter: Payer: Self-pay | Admitting: Oncology

## 2023-01-21 MED ORDER — LEVOFLOXACIN 750 MG PO TABS: 750.0000 mg | ORAL_TABLET | Freq: Every day | ORAL | 0 refills | Status: AC

## 2023-01-21 NOTE — Telephone Encounter (Signed)
Contacted pt to schedule an appt. Unable to reach via phone, voicemail was left.    Scheduling Message Entered by Rennis Harding A on 01/20/2023 at  6:04 PM Priority: Routine <No visit type provided>  Department: [facility]  Provider:  Scheduling Notes:  Start chemo on 02-01-23  Labs/appt 02-11-23

## 2023-01-25 ENCOUNTER — Other Ambulatory Visit: Payer: Self-pay

## 2023-01-26 ENCOUNTER — Telehealth: Payer: Self-pay

## 2023-01-28 NOTE — Progress Notes (Deleted)
[facility] CARE CLINIC CONSULT NOTE [facility] [phone]   [phone]   Patient Care Team: Lonie Peak, PA-C as PCP - General (Physician Assistant) Creig Hines, MD as Consulting Physician (Oncology) Mertie Moores, MD as Referring Physician (Pulmonary Disease) Benita Gutter, RN as Oncology Nurse Navigator   Name of the patient: Brandi Schultz  [id number]  02-07-1956   Date of visit: 01/28/23  Diagnosis:  Breast cancer  Chief complaint/Reason for visit- Initial Meeting for [facility], preparing for starting chemotherapy  Heme/Onc history:  Oncology History  Breast cancer (HCC)  01/06/2023 Cancer Staging   Staging form: Breast, AJCC 8th Edition - Clinical stage from 01/06/2023: Stage IIIC (cT4d, cN0, cM0, G3, ER+, PR-, HER2-) - Signed by Weston Settle, MD on 01/07/2023 Histopathologic type: Infiltrating duct carcinoma, NOS Stage prefix: Initial diagnosis Nuclear grade: G3 Histologic grading system: 3 grade system   01/07/2023 Initial Diagnosis   Breast cancer (HCC)   02/01/2023 -  Chemotherapy   Patient is on Treatment Plan : BREAST DOSE DENSE AC q14d x 4 cycles / DOSE DENSE Paclitaxel q14d x 4 cycles       Interval history-  The patient presents to chemo care clinic today for initial meeting in preparation for starting chemotherapy. I introduced the chemo care clinic and we discussed that the role of the clinic is to assist those who are at an increased risk of emergency room visits and/or complications during the course of chemotherapy treatment. We discussed that the increased risk takes into account factors such as age, performance status, and co-morbidities. We also discussed that for some, this might include barriers to care such as not having a primary care provider, lack of insurance/transportation, or not being able to afford medications. We discussed that the goal of the program is to help prevent unplanned ER  visits and help reduce complications during chemotherapy. We do this by discussing specific risk factors to each individual and identifying ways that we can help improve these risk factors and reduce barriers to care.   No Known Allergies  Past Medical History:  Diagnosis Date   Allergic rhinitis due to allergen    Atherosclerosis    Depression    Essential hypertension, benign    Graves disease    Hypercholesteremia    Hyperlipidemia    IBS (irritable bowel syndrome)    Insomnia    Interstitial lung disease (HCC)    Myalgia    Pulmonary nodules    Rectal lesion    Stage 4 very severe COPD by GOLD classification (HCC)    Statin intolerance    Urge incontinence of urine     Past Surgical History:  Procedure Laterality Date   COLONOSCOPY W/ POLYPECTOMY     FLEXIBLE SIGMOIDOSCOPY N/A 07/06/2021   Procedure: FLEXIBLE SIGMOIDOSCOPY;  Surgeon: Regis Bill, MD;  Location: [facility] ENDOSCOPY;  Service: Endoscopy;  Laterality: N/A;   TUBAL LIGATION     VAGINAL HYSTERECTOMY      Social History   Socioeconomic History   Marital status: Divorced    Spouse name: Not on file   Number of children: 3   Years of education: 9   Highest education level: Not on file  Occupational History   Occupation: RETIRED [employer]  Tobacco Use   Smoking status: Every Day    Current packs/day: 0.00    Average packs/day: 2.3 packs/day for 50.0 years (115.0 ttl pk-yrs)    Types: Cigarettes  Start date: 05/1969    Last attempt to quit: 05/2019    Years since quitting: 3.7    Passive exposure: Never   Smokeless tobacco: Never   Tobacco comments:    pt averaged 1.5 packs for 50 years, 16 of those years, pt smoked 3 packs. Total is abot 115 pack/year,   Vaping Use   Vaping status: Never Used  Substance and Sexual Activity   Alcohol use: Not Currently   Drug use: Not Currently   Sexual activity: Not Currently  Other Topics Concern   Not on file  Social History Narrative    Not on file   Social Determinants of Health   Financial Resource Strain: High Risk (10/21/2022)   Received from [facility], [facility]   Overall Financial Resource Strain (CARDIA)    Difficulty of Paying Living Expenses: Hard  Food Insecurity: No Food Insecurity (01/06/2023)   Hunger Vital Sign    Worried About Running Out of Food in the Last Year: Never true    Ran Out of Food in the Last Year: Never true  Transportation Needs: No Transportation Needs (01/06/2023)   PRAPARE - Administrator, Civil Service (Medical): No    Lack of Transportation (Non-Medical): No  Recent Concern: Transportation Needs - Unmet Transportation Needs (10/21/2022)   Received from [facility], [facility]   [facility] - Transportation    In the past 12 months, has lack of transportation kept you from medical appointments or from getting medications?: Yes    Lack of Transportation (Non-Medical): Yes  Physical Activity: Not on file  Stress: Not on file  Social Connections: Not on file  Intimate Partner Violence: Not At Risk (01/06/2023)   Humiliation, Afraid, Rape, and Kick questionnaire    Fear of Current or Ex-Partner: No    Emotionally Abused: No    Physically Abused: No    Sexually Abused: No    Family History  Problem Relation Age of Onset   Breast cancer Mother    Bone cancer Mother    Heart disease Mother    Heart disease Father    Heart attack Father    Heart disease Sister    Diabetes Brother    Colon cancer Maternal Grandmother    Breast cancer Maternal Aunt    Throat cancer Maternal Aunt      Current Outpatient Medications:    albuterol (PROVENTIL) (2.5 MG/3ML) 0.083% nebulizer solution, Inhale into the lungs every 4 (four) hours as needed., Disp: , Rfl:    albuterol (VENTOLIN HFA) 108 (90 Base) MCG/ACT inhaler, 2 puffs every 4 (four) hours as needed (wheezing)., Disp: , Rfl:    aspirin 81 MG EC tablet, Take  1 tablet by mouth daily., Disp: , Rfl:    atorvastatin (LIPITOR) 10 MG tablet, Take 1 tablet by mouth daily., Disp: , Rfl:    benzonatate (TESSALON) 100 MG capsule, Take 1 capsule by mouth 3 (three) times daily as needed., Disp: , Rfl:    clonazePAM (KLONOPIN) 0.5 MG tablet, Take 0.5 mg by mouth 2 (two) times daily as needed for anxiety., Disp: , Rfl:    cyanocobalamin 1000 MCG tablet, Take 1 tablet by mouth daily., Disp: , Rfl:    dicyclomine (BENTYL) 10 MG capsule, Take 1 capsule by mouth daily., Disp: , Rfl:    fluticasone (FLONASE) 50 MCG/ACT nasal spray, Place 1 spray into the nose daily., Disp: , Rfl:    hydrOXYzine (ATARAX) 25  MG tablet, Take 25 mg by mouth 3 (three) times daily as needed., Disp: , Rfl:    ibandronate (BONIVA) 150 MG tablet, Take 1 tablet by mouth every 30 (thirty) days., Disp: , Rfl:    ipratropium-albuterol (DUONEB) 0.5-2.5 (3) MG/3ML SOLN, Inhale into the lungs., Disp: , Rfl:    Iron-Vitamins (GERITOL COMPLETE PO), Take 1 tablet by mouth daily., Disp: , Rfl:    levofloxacin (LEVAQUIN) 750 MG tablet, Take 1 tablet (750 mg total) by mouth daily., Disp: 5 tablet, Rfl: 0   levothyroxine (SYNTHROID) 125 MCG tablet, Take 1 tablet by mouth daily., Disp: , Rfl:    montelukast (SINGULAIR) 10 MG tablet, Take 10 mg by mouth at bedtime., Disp: , Rfl:    nebivolol (BYSTOLIC) 5 MG tablet, Take 1 tablet by mouth daily., Disp: , Rfl:    omeprazole (PRILOSEC) 40 MG capsule, Take 40 mg by mouth daily., Disp: , Rfl:    OXYGEN, Inhale into the lungs., Disp: , Rfl:    pantoprazole (PROTONIX) 40 MG tablet, Take by mouth., Disp: , Rfl:    polyethylene glycol-electrolytes (NULYTELY) 420 g solution, See admin instructions., Disp: , Rfl:    predniSONE (DELTASONE) 10 MG tablet, Take 1 tablet by mouth every other day., Disp: , Rfl:    Roflumilast 250 MCG TABS, Take by mouth., Disp: , Rfl:    sertraline (ZOLOFT) 100 MG tablet, Take 1 tablet by mouth daily., Disp: , Rfl:    theophylline (THEO-24)  300 MG 24 hr capsule, Take 300 mg by mouth daily., Disp: , Rfl:    tiZANidine (ZANAFLEX) 4 MG capsule, Take 4 mg by mouth daily as needed for muscle spasms., Disp: , Rfl:    tolterodine (DETROL LA) 4 MG 24 hr capsule, Take 1 capsule by mouth daily., Disp: , Rfl:    TRELEGY ELLIPTA 100-62.5-25 MCG/ACT AEPB, , Disp: , Rfl:      Latest Ref Rng & Units 06/22/2021    2:13 PM  CMP  Glucose 70 - 99 mg/dL 95   BUN 8 - 23 mg/dL 11   Creatinine 4.09 - 1.00 mg/dL 8.11   Sodium 914 - 782 mmol/L 134   Potassium 3.5 - 5.1 mmol/L 4.0   Chloride 98 - 111 mmol/L 98   CO2 22 - 32 mmol/L 29   Calcium 8.9 - 10.3 mg/dL 9.0   Total Protein 6.5 - 8.1 g/dL 7.5   Total Bilirubin 0.3 - 1.2 mg/dL 0.3   Alkaline Phos 38 - 126 U/L 50   AST 15 - 41 U/L 15   ALT 0 - 44 U/L 11       Latest Ref Rng & Units 06/22/2021    2:13 PM  CBC  WBC 4.0 - 10.5 K/uL 7.1   Hemoglobin 12.0 - 15.0 g/dL 95.6   Hematocrit 21.3 - 46.0 % 44.0   Platelets 150 - 400 K/uL 151     No images are attached to the encounter.  No results found.   Assessment and plan-  The patient is a 67 y.o. female who presents to [facility] for initial meeting in preparation for starting chemotherapy for the treatment of  1. Malignant neoplasm of upper-outer quadrant of left breast in female, estrogen receptor positive (HCC)   .   Chemo Care Clinic/High Risk for ER/Hospitalization during chemotherapy- We discussed the role of the chemo care clinic and identified patient specific risk factors. I discussed that patient was identified as high risk primarily based on:  Patient has  past medical history positive for: Past Medical History:  Diagnosis Date   Allergic rhinitis due to allergen    Atherosclerosis    Depression    Essential hypertension, benign    Graves disease    Hypercholesteremia    Hyperlipidemia    IBS (irritable bowel syndrome)    Insomnia    Interstitial lung disease (HCC)    Myalgia    Pulmonary nodules    Rectal  lesion    Stage 4 very severe COPD by GOLD classification (HCC)    Statin intolerance    Urge incontinence of urine     Patient has past surgical history positive for: Past Surgical History:  Procedure Laterality Date   COLONOSCOPY W/ POLYPECTOMY     FLEXIBLE SIGMOIDOSCOPY N/A 07/06/2021   Procedure: FLEXIBLE SIGMOIDOSCOPY;  Surgeon: Regis Bill, MD;  Location: [facility] ENDOSCOPY;  Service: Endoscopy;  Laterality: N/A;   TUBAL LIGATION     VAGINAL HYSTERECTOMY      Provided general information including the following:  1.  Date of education: 01/31/2023 2.  Physician name: Dr. Rennis Harding 3.  Diagnosis: This cancer 4.  Stage: IIIC 5.  Cure  6.  Chemotherapy plan including drugs and how often: Doxorubicin/cyclophosphamide every 2 weeks for 4 cycles followed by paclitaxel every 2 weeks for 4 cycles 7.  Start date: 02/01/2023 8.  Other referrals: None at this time 9.  The patient is to call our office with any questions or concerns.  Our office number [phone], if after hours or on the weekend, call the same number and wait for the answering service.  There is always an oncologist on call. 10.  Medications prescribed: ondansetron, prochlorperazine 11.  The patient has verbalized understanding of the treatment plan and has no barriers to adherence or understanding.   Obtained signed consent from patient.   Discussed symptoms including:  1.  Low blood counts including white blood cells red blood cells, and platelets.  If experience increased fatigue or abnormal bruising or bleeding, call our office. 2.  Infection including to avoid large crowds, wash hands frequently, and stay away from people who were sick.  If fever develops of 100.4 or higher, call our office. 3.  Mucositis:  Instructions on mouth rinse given (baking soda and salt mixture).  Keep mouth clean.  Use soft bristle toothbrush.  Avoid alcohol containing mouthwash.  If mouth sores develop, call our office. 4.   Nausea/vomiting:  Prescriptions given: ondansetron 8 mg every 8 hours as needed for nausea or vomiting and prochlorperazine 10 mg every 6 hours as needed for nausea or vomiting, may alternate these medications and take around the clock if persistent.  If nausea and vomiting is not controlled, call our office 5.  Diarrhea: Use over-the-counter Imodium.  Call our office if diarrhea is not controlled. 6.  Constipation: Use senna-S, 1 to 2 tablets twice a day.  Call our office if no BM in 2 to 3 days. 7.  Loss of appetite:  Try to eat small meals every 2-3 hours.  Call our office if not able to eat or drink. 8.  Taste changes:  Try zinc 50 mg daily.  If becomes severe call clinic. 9.  Drink 2 to 3 quarts of water per day. Call our office if not able to drink enough for urine to be pale yellow. 10. Avoid alcoholic beverages. 11. Peripheral neuropathy: Call office if numbness or tingling in hands or feet worsens or is suddenly severe. 12.  Ringing  in the ears or hearing loss.  Call our office if this develops.    Due to risk of neutropenia, peg-filgrastim (Neulasta) will be given 24 to 48 hours after chemotherapy.  Gave information sheet on bone and joint pain.  Use Claritin or Pepcid.  May use ibuprofen or Aleve.  Call if symptoms persist or are unbearable.   The patient was given written information printed from [employer] patient education on individual chemotherapy agents which includes: Name of medications Approved uses Dose and schedule Storage and handling Handling body fluids and waste Drug and food interactions Possible side effects and management Pregnancy, sexual activity, and contraception Obtaining medication   Gave information on the supportive care team and how to contact them regarding services.  Discussed advanced directives.  The patient does not have their advanced directives.   We discussed that social determinants of health may have significant impacts on health and outcomes  for cancer patients.  Today we discussed specific social determinants of performance status, alcohol use, depression, financial needs, food insecurity, housing, interpersonal violence, social connections, stress, tobacco use, and transportation.    After lengthy discussion the following were identified as areas of need:   Outpatient services: We discussed options including home based and outpatient services, DME, nutrition counseling, and supportive care program. We discussed that patients who participate in regular physical activity report fewer negative impacts of cancer and treatments and report less fatigue.   Financial Concerns: We discussed that living with cancer can create tremendous financial burden.  We discussed options for assistance. I asked that if assistance is needed in affording medications or paying bills to please let us know so that we can provide assistance. We discussed options for food including social services.  Referral to Social work:    Introduced services available, such as support with utility bill, cell phone and gas vouchers.  Introduced [place], [place] who can provide individual counseling.   Support groups: We discussed options for support groups at [facility]. We discussed options for managing stress including healthy eating and exercise, as well as participating in no charge counseling services at the cancer center and support groups.  If these are of interest, patient can notify either myself or primary nursing team.We discussed options for management including medications.  Transportation: We discussed options for transportation.  The patient will contact our office if she requires assistance with transportation.  Palliative care services: We have palliative care services available in the cancer center to discuss goals of care and advanced care planning.  Please let us know if you have any questions or would like to speak to our palliative care  practitioner.  Symptom Management Clinic: We discussed our symptom management clinic which is available for acute concerns while receiving treatment such as nausea, vomiting or diarrhea.  We can be reached via telephone at [phone].  We are available for virtual or in person visits on the same day from 9 to 4 PM Monday through Friday.   She denies needing specific assistance at this time.  She will be followed by Dr. Theadore Nan clinical team.   Disposition: RTC on 02/14/2023  Visit Diagnosis 1. Malignant neoplasm of upper-outer quadrant of left breast in female, estrogen receptor positive (HCC)     I discussed the assessment and treatment plan with the patient.  The patient was provided an opportunity to ask questions and all were answered. The patient expressed understanding and was in agreement with this plan. She also understands that she can call  clinic at any time with any questions, concerns, or complaints.   I provided *** minutes of {Blank single:19197::"face-to-face time","face-to-face video visit time","non face-to-face telephone visit time"} during this encounter, and > 50% was spent counseling as documented under my assessment & plan.   Davionne Mastrangelo A. Sol Passer, PA-C [facility] [phone]

## 2023-01-31 ENCOUNTER — Inpatient Hospital Stay: Payer: Medicare HMO

## 2023-01-31 ENCOUNTER — Inpatient Hospital Stay: Payer: Medicare HMO | Admitting: Hematology and Oncology

## 2023-01-31 ENCOUNTER — Encounter: Payer: Self-pay | Admitting: Oncology

## 2023-01-31 DIAGNOSIS — C50412 Malignant neoplasm of upper-outer quadrant of left female breast: Secondary | ICD-10-CM

## 2023-02-01 ENCOUNTER — Inpatient Hospital Stay: Payer: Medicare HMO

## 2023-02-03 ENCOUNTER — Ambulatory Visit: Payer: Medicare HMO

## 2023-02-07 DIAGNOSIS — T451X1A Poisoning by antineoplastic and immunosuppressive drugs, accidental (unintentional), initial encounter: Secondary | ICD-10-CM | POA: Diagnosis not present

## 2023-02-07 DIAGNOSIS — C50312 Malignant neoplasm of lower-inner quadrant of left female breast: Secondary | ICD-10-CM | POA: Diagnosis not present

## 2023-02-07 DIAGNOSIS — Z79899 Other long term (current) drug therapy: Secondary | ICD-10-CM | POA: Diagnosis not present

## 2023-02-07 DIAGNOSIS — I3139 Other pericardial effusion (noninflammatory): Secondary | ICD-10-CM | POA: Diagnosis not present

## 2023-02-09 ENCOUNTER — Encounter: Payer: Self-pay | Admitting: Oncology

## 2023-02-14 ENCOUNTER — Ambulatory Visit: Payer: Medicare HMO | Admitting: Oncology

## 2023-02-14 ENCOUNTER — Other Ambulatory Visit: Payer: Medicare HMO

## 2023-02-15 DIAGNOSIS — N63 Unspecified lump in unspecified breast: Secondary | ICD-10-CM | POA: Diagnosis not present

## 2023-02-15 DIAGNOSIS — J449 Chronic obstructive pulmonary disease, unspecified: Secondary | ICD-10-CM | POA: Diagnosis not present

## 2023-02-15 DIAGNOSIS — R000 Tachycardia, unspecified: Secondary | ICD-10-CM | POA: Diagnosis not present

## 2023-02-15 DIAGNOSIS — H26413 Soemmering's ring, bilateral: Secondary | ICD-10-CM | POA: Diagnosis not present

## 2023-02-15 DIAGNOSIS — Z9981 Dependence on supplemental oxygen: Secondary | ICD-10-CM | POA: Diagnosis not present

## 2023-02-16 ENCOUNTER — Ambulatory Visit: Payer: Medicare HMO

## 2023-02-17 ENCOUNTER — Ambulatory Visit: Payer: Medicare HMO

## 2023-02-22 DIAGNOSIS — C50412 Malignant neoplasm of upper-outer quadrant of left female breast: Secondary | ICD-10-CM | POA: Diagnosis not present

## 2023-02-22 DIAGNOSIS — Z170 Estrogen receptor positive status [ER+]: Secondary | ICD-10-CM | POA: Diagnosis not present

## 2023-02-24 DIAGNOSIS — B371 Pulmonary candidiasis: Secondary | ICD-10-CM | POA: Diagnosis not present

## 2023-02-24 DIAGNOSIS — J159 Unspecified bacterial pneumonia: Secondary | ICD-10-CM | POA: Diagnosis not present

## 2023-02-24 DIAGNOSIS — Z9911 Dependence on respirator [ventilator] status: Secondary | ICD-10-CM | POA: Diagnosis not present

## 2023-02-24 DIAGNOSIS — Z430 Encounter for attention to tracheostomy: Secondary | ICD-10-CM | POA: Diagnosis not present

## 2023-02-24 DIAGNOSIS — R6521 Severe sepsis with septic shock: Secondary | ICD-10-CM | POA: Diagnosis not present

## 2023-02-24 DIAGNOSIS — Z01818 Encounter for other preprocedural examination: Secondary | ICD-10-CM | POA: Diagnosis not present

## 2023-02-24 DIAGNOSIS — D696 Thrombocytopenia, unspecified: Secondary | ICD-10-CM | POA: Diagnosis not present

## 2023-02-24 DIAGNOSIS — Z4682 Encounter for fitting and adjustment of non-vascular catheter: Secondary | ICD-10-CM | POA: Diagnosis not present

## 2023-02-24 DIAGNOSIS — R0902 Hypoxemia: Secondary | ICD-10-CM | POA: Diagnosis not present

## 2023-02-24 DIAGNOSIS — E860 Dehydration: Secondary | ICD-10-CM | POA: Diagnosis not present

## 2023-02-24 DIAGNOSIS — Z9889 Other specified postprocedural states: Secondary | ICD-10-CM | POA: Diagnosis not present

## 2023-02-24 DIAGNOSIS — A419 Sepsis, unspecified organism: Secondary | ICD-10-CM | POA: Diagnosis not present

## 2023-02-24 DIAGNOSIS — Z9989 Dependence on other enabling machines and devices: Secondary | ICD-10-CM | POA: Diagnosis not present

## 2023-02-24 DIAGNOSIS — Z930 Tracheostomy status: Secondary | ICD-10-CM | POA: Diagnosis not present

## 2023-02-24 DIAGNOSIS — J180 Bronchopneumonia, unspecified organism: Secondary | ICD-10-CM | POA: Diagnosis not present

## 2023-02-24 DIAGNOSIS — J90 Pleural effusion, not elsewhere classified: Secondary | ICD-10-CM | POA: Diagnosis not present

## 2023-02-24 DIAGNOSIS — J9622 Acute and chronic respiratory failure with hypercapnia: Secondary | ICD-10-CM | POA: Diagnosis not present

## 2023-02-24 DIAGNOSIS — R918 Other nonspecific abnormal finding of lung field: Secondary | ICD-10-CM | POA: Diagnosis not present

## 2023-02-24 DIAGNOSIS — Z9010 Acquired absence of unspecified breast and nipple: Secondary | ICD-10-CM | POA: Diagnosis not present

## 2023-02-24 DIAGNOSIS — E43 Unspecified severe protein-calorie malnutrition: Secondary | ICD-10-CM | POA: Diagnosis not present

## 2023-02-24 DIAGNOSIS — J690 Pneumonitis due to inhalation of food and vomit: Secondary | ICD-10-CM | POA: Diagnosis not present

## 2023-02-24 DIAGNOSIS — J158 Pneumonia due to other specified bacteria: Secondary | ICD-10-CM | POA: Diagnosis not present

## 2023-02-24 DIAGNOSIS — R319 Hematuria, unspecified: Secondary | ICD-10-CM | POA: Diagnosis not present

## 2023-02-24 DIAGNOSIS — C44521 Squamous cell carcinoma of skin of breast: Secondary | ICD-10-CM | POA: Diagnosis not present

## 2023-02-24 DIAGNOSIS — Z431 Encounter for attention to gastrostomy: Secondary | ICD-10-CM | POA: Diagnosis not present

## 2023-02-24 DIAGNOSIS — J449 Chronic obstructive pulmonary disease, unspecified: Secondary | ICD-10-CM | POA: Diagnosis not present

## 2023-02-24 DIAGNOSIS — Z720 Tobacco use: Secondary | ICD-10-CM | POA: Diagnosis not present

## 2023-02-24 DIAGNOSIS — J9811 Atelectasis: Secondary | ICD-10-CM | POA: Diagnosis not present

## 2023-02-24 DIAGNOSIS — C50912 Malignant neoplasm of unspecified site of left female breast: Secondary | ICD-10-CM | POA: Diagnosis not present

## 2023-02-24 DIAGNOSIS — C50919 Malignant neoplasm of unspecified site of unspecified female breast: Secondary | ICD-10-CM | POA: Diagnosis not present

## 2023-02-24 DIAGNOSIS — J9601 Acute respiratory failure with hypoxia: Secondary | ICD-10-CM | POA: Diagnosis not present

## 2023-02-24 DIAGNOSIS — C50412 Malignant neoplasm of upper-outer quadrant of left female breast: Secondary | ICD-10-CM | POA: Diagnosis not present

## 2023-02-24 DIAGNOSIS — J9611 Chronic respiratory failure with hypoxia: Secondary | ICD-10-CM | POA: Diagnosis not present

## 2023-02-24 DIAGNOSIS — J42 Unspecified chronic bronchitis: Secondary | ICD-10-CM | POA: Diagnosis not present

## 2023-02-24 DIAGNOSIS — I10 Essential (primary) hypertension: Secondary | ICD-10-CM | POA: Diagnosis not present

## 2023-02-24 DIAGNOSIS — Y95 Nosocomial condition: Secondary | ICD-10-CM | POA: Diagnosis not present

## 2023-02-24 DIAGNOSIS — K5900 Constipation, unspecified: Secondary | ICD-10-CM | POA: Diagnosis not present

## 2023-02-24 DIAGNOSIS — J9621 Acute and chronic respiratory failure with hypoxia: Secondary | ICD-10-CM | POA: Diagnosis not present

## 2023-02-24 DIAGNOSIS — J9600 Acute respiratory failure, unspecified whether with hypoxia or hypercapnia: Secondary | ICD-10-CM | POA: Diagnosis not present

## 2023-02-24 DIAGNOSIS — R000 Tachycardia, unspecified: Secondary | ICD-10-CM | POA: Diagnosis not present

## 2023-02-24 DIAGNOSIS — J439 Emphysema, unspecified: Secondary | ICD-10-CM | POA: Diagnosis not present

## 2023-02-24 DIAGNOSIS — R0689 Other abnormalities of breathing: Secondary | ICD-10-CM | POA: Diagnosis not present

## 2023-02-24 DIAGNOSIS — J440 Chronic obstructive pulmonary disease with acute lower respiratory infection: Secondary | ICD-10-CM | POA: Diagnosis not present

## 2023-02-24 DIAGNOSIS — Z0289 Encounter for other administrative examinations: Secondary | ICD-10-CM | POA: Diagnosis not present

## 2023-02-25 ENCOUNTER — Encounter: Payer: Self-pay | Admitting: Oncology

## 2023-02-25 DIAGNOSIS — R000 Tachycardia, unspecified: Secondary | ICD-10-CM | POA: Diagnosis not present

## 2023-02-25 DIAGNOSIS — C50912 Malignant neoplasm of unspecified site of left female breast: Secondary | ICD-10-CM | POA: Diagnosis not present

## 2023-03-04 ENCOUNTER — Encounter: Payer: Self-pay | Admitting: Internal Medicine

## 2023-03-23 ENCOUNTER — Other Ambulatory Visit (HOSPITAL_COMMUNITY): Payer: Medicare HMO

## 2023-03-23 ENCOUNTER — Inpatient Hospital Stay
Admission: AD | Admit: 2023-03-23 | Discharge: 2023-04-22 | Disposition: A | Discharge status: Skilled Nursing Facility | Payer: Medicare HMO | Source: Other Acute Inpatient Hospital | Attending: Internal Medicine | Admitting: Internal Medicine

## 2023-03-23 DIAGNOSIS — D696 Thrombocytopenia, unspecified: Secondary | ICD-10-CM | POA: Diagnosis not present

## 2023-03-23 DIAGNOSIS — E875 Hyperkalemia: Secondary | ICD-10-CM | POA: Diagnosis not present

## 2023-03-23 DIAGNOSIS — Z430 Encounter for attention to tracheostomy: Secondary | ICD-10-CM | POA: Diagnosis not present

## 2023-03-23 DIAGNOSIS — J158 Pneumonia due to other specified bacteria: Secondary | ICD-10-CM | POA: Diagnosis not present

## 2023-03-23 DIAGNOSIS — E43 Unspecified severe protein-calorie malnutrition: Secondary | ICD-10-CM | POA: Diagnosis not present

## 2023-03-23 DIAGNOSIS — J440 Chronic obstructive pulmonary disease with acute lower respiratory infection: Secondary | ICD-10-CM | POA: Diagnosis not present

## 2023-03-23 DIAGNOSIS — R531 Weakness: Secondary | ICD-10-CM | POA: Diagnosis not present

## 2023-03-23 DIAGNOSIS — C44521 Squamous cell carcinoma of skin of breast: Secondary | ICD-10-CM | POA: Diagnosis not present

## 2023-03-23 DIAGNOSIS — C50912 Malignant neoplasm of unspecified site of left female breast: Secondary | ICD-10-CM | POA: Diagnosis not present

## 2023-03-23 DIAGNOSIS — Z431 Encounter for attention to gastrostomy: Secondary | ICD-10-CM | POA: Diagnosis not present

## 2023-03-23 DIAGNOSIS — C50919 Malignant neoplasm of unspecified site of unspecified female breast: Secondary | ICD-10-CM | POA: Diagnosis not present

## 2023-03-23 DIAGNOSIS — Z743 Need for continuous supervision: Secondary | ICD-10-CM | POA: Diagnosis not present

## 2023-03-23 DIAGNOSIS — Z930 Tracheostomy status: Secondary | ICD-10-CM | POA: Diagnosis not present

## 2023-03-23 DIAGNOSIS — J159 Unspecified bacterial pneumonia: Secondary | ICD-10-CM | POA: Diagnosis not present

## 2023-03-23 DIAGNOSIS — Y95 Nosocomial condition: Secondary | ICD-10-CM | POA: Diagnosis not present

## 2023-03-23 DIAGNOSIS — J42 Unspecified chronic bronchitis: Secondary | ICD-10-CM | POA: Diagnosis not present

## 2023-03-23 DIAGNOSIS — I10 Essential (primary) hypertension: Secondary | ICD-10-CM | POA: Diagnosis not present

## 2023-03-23 DIAGNOSIS — J441 Chronic obstructive pulmonary disease with (acute) exacerbation: Secondary | ICD-10-CM | POA: Diagnosis not present

## 2023-03-23 DIAGNOSIS — R1310 Dysphagia, unspecified: Secondary | ICD-10-CM | POA: Diagnosis not present

## 2023-03-23 DIAGNOSIS — J9601 Acute respiratory failure with hypoxia: Secondary | ICD-10-CM | POA: Diagnosis not present

## 2023-03-23 DIAGNOSIS — E0501 Thyrotoxicosis with diffuse goiter with thyrotoxic crisis or storm: Secondary | ICD-10-CM | POA: Diagnosis not present

## 2023-03-23 DIAGNOSIS — J9621 Acute and chronic respiratory failure with hypoxia: Secondary | ICD-10-CM | POA: Diagnosis not present

## 2023-03-23 LAB — CBC WITH DIFFERENTIAL/PLATELET
Abs Immature Granulocytes: 0.01 10*3/uL (ref 0.00–0.07)
Basophils Absolute: 0 10*3/uL (ref 0.0–0.1)
Basophils Relative: 0 %
Eosinophils Absolute: 0 10*3/uL (ref 0.0–0.5)
Eosinophils Relative: 0 %
HCT: 23.8 % — ABNORMAL LOW (ref 36.0–46.0)
Hemoglobin: 7.5 g/dL — ABNORMAL LOW (ref 12.0–15.0)
Immature Granulocytes: 0 %
Lymphocytes Relative: 5 %
Lymphs Abs: 0.2 10*3/uL — ABNORMAL LOW (ref 0.7–4.0)
MCH: 28.2 pg (ref 26.0–34.0)
MCHC: 31.5 g/dL (ref 30.0–36.0)
MCV: 89.5 fL (ref 80.0–100.0)
Monocytes Absolute: 0.1 10*3/uL (ref 0.1–1.0)
Monocytes Relative: 4 %
Neutro Abs: 2.8 10*3/uL (ref 1.7–7.7)
Neutrophils Relative %: 91 %
Platelets: 42 10*3/uL — ABNORMAL LOW (ref 150–400)
RBC: 2.66 MIL/uL — ABNORMAL LOW (ref 3.87–5.11)
RDW: 17.8 % — ABNORMAL HIGH (ref 11.5–15.5)
WBC: 3.1 10*3/uL — ABNORMAL LOW (ref 4.0–10.5)
nRBC: 0 % (ref 0.0–0.2)

## 2023-03-23 LAB — BLOOD GAS, ARTERIAL
Acid-Base Excess: 16.8 mmol/L — ABNORMAL HIGH (ref 0.0–2.0)
Bicarbonate: 43.7 mmol/L — ABNORMAL HIGH (ref 20.0–28.0)
O2 Saturation: 99.3 %
Patient temperature: 36.1
pCO2 arterial: 58 mm[Hg] — ABNORMAL HIGH (ref 32–48)
pH, Arterial: 7.48 — ABNORMAL HIGH (ref 7.35–7.45)
pO2, Arterial: 77 mm[Hg] — ABNORMAL LOW (ref 83–108)

## 2023-03-23 LAB — MAGNESIUM: Magnesium: 1.6 mg/dL — ABNORMAL LOW (ref 1.7–2.4)

## 2023-03-23 LAB — BASIC METABOLIC PANEL
Anion gap: 10 (ref 5–15)
BUN: 8 mg/dL (ref 8–23)
CO2: 37 mmol/L — ABNORMAL HIGH (ref 22–32)
Calcium: 8.5 mg/dL — ABNORMAL LOW (ref 8.9–10.3)
Chloride: 89 mmol/L — ABNORMAL LOW (ref 98–111)
Creatinine, Ser: <0.3 mg/dL — ABNORMAL LOW (ref 0.44–1.00)
Glucose, Bld: 82 mg/dL (ref 70–99)
Potassium: 3.4 mmol/L — ABNORMAL LOW (ref 3.5–5.1)
Sodium: 136 mmol/L (ref 135–145)

## 2023-03-23 LAB — PHOSPHORUS: Phosphorus: 2.2 mg/dL — ABNORMAL LOW (ref 2.5–4.6)

## 2023-03-23 MED ORDER — DIATRIZOATE MEGLUMINE & SODIUM 66-10 % PO SOLN
20.0000 mL | Freq: Once | ORAL | Status: AC
  Administered 2023-03-23: 20 mL

## 2023-03-23 MED ORDER — DIATRIZOATE MEGLUMINE & SODIUM 66-10 % PO SOLN
ORAL | Status: AC
Start: 2023-03-23 — End: ?
  Filled 2023-03-23: qty 30

## 2023-03-24 ENCOUNTER — Other Ambulatory Visit: Payer: Self-pay

## 2023-03-24 LAB — BASIC METABOLIC PANEL
Anion gap: 6 (ref 5–15)
BUN: 10 mg/dL (ref 8–23)
CO2: 38 mmol/L — ABNORMAL HIGH (ref 22–32)
Calcium: 8.4 mg/dL — ABNORMAL LOW (ref 8.9–10.3)
Chloride: 90 mmol/L — ABNORMAL LOW (ref 98–111)
Creatinine, Ser: <0.3 mg/dL — ABNORMAL LOW (ref 0.44–1.00)
Glucose, Bld: 133 mg/dL — ABNORMAL HIGH (ref 70–99)
Potassium: 3.2 mmol/L — ABNORMAL LOW (ref 3.5–5.1)
Sodium: 134 mmol/L — ABNORMAL LOW (ref 135–145)

## 2023-03-24 LAB — CBC WITH DIFFERENTIAL/PLATELET
Abs Immature Granulocytes: 0.03 10*3/uL (ref 0.00–0.07)
Basophils Absolute: 0 10*3/uL (ref 0.0–0.1)
Basophils Relative: 0 %
Eosinophils Absolute: 0 10*3/uL (ref 0.0–0.5)
Eosinophils Relative: 0 %
HCT: 26.1 % — ABNORMAL LOW (ref 36.0–46.0)
Hemoglobin: 8.4 g/dL — ABNORMAL LOW (ref 12.0–15.0)
Immature Granulocytes: 1 %
Lymphocytes Relative: 7 %
Lymphs Abs: 0.2 10*3/uL — ABNORMAL LOW (ref 0.7–4.0)
MCH: 28.4 pg (ref 26.0–34.0)
MCHC: 32.2 g/dL (ref 30.0–36.0)
MCV: 88.2 fL (ref 80.0–100.0)
Monocytes Absolute: 0.1 10*3/uL (ref 0.1–1.0)
Monocytes Relative: 4 %
Neutro Abs: 2.4 10*3/uL (ref 1.7–7.7)
Neutrophils Relative %: 88 %
Platelets: 49 10*3/uL — ABNORMAL LOW (ref 150–400)
RBC: 2.96 MIL/uL — ABNORMAL LOW (ref 3.87–5.11)
RDW: 18 % — ABNORMAL HIGH (ref 11.5–15.5)
WBC: 2.8 10*3/uL — ABNORMAL LOW (ref 4.0–10.5)
nRBC: 0 % (ref 0.0–0.2)

## 2023-03-24 LAB — PHOSPHORUS: Phosphorus: 2.3 mg/dL — ABNORMAL LOW (ref 2.5–4.6)

## 2023-03-24 LAB — MAGNESIUM: Magnesium: 1.7 mg/dL (ref 1.7–2.4)

## 2023-03-24 NOTE — Progress Notes (Signed)
These treatment recommendations were for the purposes of discussion only and not a part of the official treatment plan.

## 2023-03-25 DIAGNOSIS — J42 Unspecified chronic bronchitis: Secondary | ICD-10-CM | POA: Diagnosis not present

## 2023-03-25 DIAGNOSIS — Z930 Tracheostomy status: Secondary | ICD-10-CM | POA: Diagnosis not present

## 2023-03-25 DIAGNOSIS — J159 Unspecified bacterial pneumonia: Secondary | ICD-10-CM | POA: Diagnosis not present

## 2023-03-25 DIAGNOSIS — J9621 Acute and chronic respiratory failure with hypoxia: Secondary | ICD-10-CM | POA: Diagnosis not present

## 2023-03-25 DIAGNOSIS — Y95 Nosocomial condition: Secondary | ICD-10-CM | POA: Diagnosis not present

## 2023-03-25 LAB — CBC WITH DIFFERENTIAL/PLATELET
Abs Immature Granulocytes: 0.01 10*3/uL (ref 0.00–0.07)
Basophils Absolute: 0 10*3/uL (ref 0.0–0.1)
Basophils Relative: 0 %
Eosinophils Absolute: 0 10*3/uL (ref 0.0–0.5)
Eosinophils Relative: 0 %
HCT: 26.6 % — ABNORMAL LOW (ref 36.0–46.0)
Hemoglobin: 8.5 g/dL — ABNORMAL LOW (ref 12.0–15.0)
Immature Granulocytes: 0 %
Lymphocytes Relative: 7 %
Lymphs Abs: 0.2 10*3/uL — ABNORMAL LOW (ref 0.7–4.0)
MCH: 28 pg (ref 26.0–34.0)
MCHC: 32 g/dL (ref 30.0–36.0)
MCV: 87.5 fL (ref 80.0–100.0)
Monocytes Absolute: 0.1 10*3/uL (ref 0.1–1.0)
Monocytes Relative: 5 %
Neutro Abs: 2.3 10*3/uL (ref 1.7–7.7)
Neutrophils Relative %: 88 %
Platelets: 58 10*3/uL — ABNORMAL LOW (ref 150–400)
RBC: 3.04 MIL/uL — ABNORMAL LOW (ref 3.87–5.11)
RDW: 18.2 % — ABNORMAL HIGH (ref 11.5–15.5)
WBC: 2.6 10*3/uL — ABNORMAL LOW (ref 4.0–10.5)
nRBC: 0 % (ref 0.0–0.2)

## 2023-03-25 LAB — BASIC METABOLIC PANEL
Anion gap: 11 (ref 5–15)
BUN: 9 mg/dL (ref 8–23)
CO2: 37 mmol/L — ABNORMAL HIGH (ref 22–32)
Calcium: 8.3 mg/dL — ABNORMAL LOW (ref 8.9–10.3)
Chloride: 88 mmol/L — ABNORMAL LOW (ref 98–111)
Creatinine, Ser: 0.34 mg/dL — ABNORMAL LOW (ref 0.44–1.00)
GFR, Estimated: >60 mL/min (ref >60)
Glucose, Bld: 121 mg/dL — ABNORMAL HIGH (ref 70–99)
Potassium: 3.4 mmol/L — ABNORMAL LOW (ref 3.5–5.1)
Sodium: 136 mmol/L (ref 135–145)

## 2023-03-25 LAB — MAGNESIUM: Magnesium: 1.6 mg/dL — ABNORMAL LOW (ref 1.7–2.4)

## 2023-03-25 LAB — PHOSPHORUS: Phosphorus: 2.9 mg/dL (ref 2.5–4.6)

## 2023-03-26 ENCOUNTER — Other Ambulatory Visit: Payer: Self-pay

## 2023-03-26 DIAGNOSIS — J9621 Acute and chronic respiratory failure with hypoxia: Secondary | ICD-10-CM | POA: Diagnosis not present

## 2023-03-26 DIAGNOSIS — J159 Unspecified bacterial pneumonia: Secondary | ICD-10-CM | POA: Diagnosis not present

## 2023-03-26 DIAGNOSIS — Z930 Tracheostomy status: Secondary | ICD-10-CM | POA: Diagnosis not present

## 2023-03-26 DIAGNOSIS — Y95 Nosocomial condition: Secondary | ICD-10-CM | POA: Diagnosis not present

## 2023-03-26 DIAGNOSIS — J42 Unspecified chronic bronchitis: Secondary | ICD-10-CM | POA: Diagnosis not present

## 2023-03-27 DIAGNOSIS — J42 Unspecified chronic bronchitis: Secondary | ICD-10-CM | POA: Diagnosis not present

## 2023-03-27 DIAGNOSIS — J9621 Acute and chronic respiratory failure with hypoxia: Secondary | ICD-10-CM | POA: Diagnosis not present

## 2023-03-27 DIAGNOSIS — Z930 Tracheostomy status: Secondary | ICD-10-CM | POA: Diagnosis not present

## 2023-03-27 DIAGNOSIS — J159 Unspecified bacterial pneumonia: Secondary | ICD-10-CM | POA: Diagnosis not present

## 2023-03-27 DIAGNOSIS — Y95 Nosocomial condition: Secondary | ICD-10-CM | POA: Diagnosis not present

## 2023-03-27 LAB — CBC WITH DIFFERENTIAL/PLATELET
Abs Immature Granulocytes: 0.01 10*3/uL (ref 0.00–0.07)
Basophils Absolute: 0 10*3/uL (ref 0.0–0.1)
Basophils Relative: 0 %
Eosinophils Absolute: 0 10*3/uL (ref 0.0–0.5)
Eosinophils Relative: 0 %
HCT: 28.8 % — ABNORMAL LOW (ref 36.0–46.0)
Hemoglobin: 9.1 g/dL — ABNORMAL LOW (ref 12.0–15.0)
Immature Granulocytes: 1 %
Lymphocytes Relative: 4 %
Lymphs Abs: 0.1 10*3/uL — ABNORMAL LOW (ref 0.7–4.0)
MCH: 27.2 pg (ref 26.0–34.0)
MCHC: 31.6 g/dL (ref 30.0–36.0)
MCV: 86 fL (ref 80.0–100.0)
Monocytes Absolute: 0.1 10*3/uL (ref 0.1–1.0)
Monocytes Relative: 4 %
Neutro Abs: 2 10*3/uL (ref 1.7–7.7)
Neutrophils Relative %: 91 %
Platelets: 100 10*3/uL — ABNORMAL LOW (ref 150–400)
RBC: 3.35 MIL/uL — ABNORMAL LOW (ref 3.87–5.11)
RDW: 17.8 % — ABNORMAL HIGH (ref 11.5–15.5)
WBC: 2.2 10*3/uL — ABNORMAL LOW (ref 4.0–10.5)
nRBC: 0 % (ref 0.0–0.2)

## 2023-03-27 LAB — BASIC METABOLIC PANEL
Anion gap: 8 (ref 5–15)
BUN: 7 mg/dL — ABNORMAL LOW (ref 8–23)
CO2: 41 mmol/L — ABNORMAL HIGH (ref 22–32)
Calcium: 8.2 mg/dL — ABNORMAL LOW (ref 8.9–10.3)
Chloride: 85 mmol/L — ABNORMAL LOW (ref 98–111)
Creatinine, Ser: <0.3 mg/dL — ABNORMAL LOW (ref 0.44–1.00)
Glucose, Bld: 171 mg/dL — ABNORMAL HIGH (ref 70–99)
Potassium: 2.5 mmol/L — CL (ref 3.5–5.1)
Sodium: 134 mmol/L — ABNORMAL LOW (ref 135–145)

## 2023-03-28 DIAGNOSIS — J9621 Acute and chronic respiratory failure with hypoxia: Secondary | ICD-10-CM | POA: Diagnosis not present

## 2023-03-28 DIAGNOSIS — Z930 Tracheostomy status: Secondary | ICD-10-CM | POA: Diagnosis not present

## 2023-03-28 DIAGNOSIS — J9601 Acute respiratory failure with hypoxia: Secondary | ICD-10-CM | POA: Diagnosis not present

## 2023-03-28 DIAGNOSIS — I10 Essential (primary) hypertension: Secondary | ICD-10-CM | POA: Diagnosis not present

## 2023-03-28 DIAGNOSIS — J159 Unspecified bacterial pneumonia: Secondary | ICD-10-CM | POA: Diagnosis not present

## 2023-03-28 DIAGNOSIS — J42 Unspecified chronic bronchitis: Secondary | ICD-10-CM | POA: Diagnosis not present

## 2023-03-28 DIAGNOSIS — Y95 Nosocomial condition: Secondary | ICD-10-CM | POA: Diagnosis not present

## 2023-03-28 LAB — CBC WITH DIFFERENTIAL/PLATELET
Abs Immature Granulocytes: 0.02 10*3/uL (ref 0.00–0.07)
Basophils Absolute: 0 10*3/uL (ref 0.0–0.1)
Basophils Relative: 0 %
Eosinophils Absolute: 0 10*3/uL (ref 0.0–0.5)
Eosinophils Relative: 0 %
HCT: 28.6 % — ABNORMAL LOW (ref 36.0–46.0)
Hemoglobin: 8.8 g/dL — ABNORMAL LOW (ref 12.0–15.0)
Immature Granulocytes: 1 %
Lymphocytes Relative: 4 %
Lymphs Abs: 0.2 10*3/uL — ABNORMAL LOW (ref 0.7–4.0)
MCH: 26.5 pg (ref 26.0–34.0)
MCHC: 30.8 g/dL (ref 30.0–36.0)
MCV: 86.1 fL (ref 80.0–100.0)
Monocytes Absolute: 0.3 10*3/uL (ref 0.1–1.0)
Monocytes Relative: 6 %
Neutro Abs: 3.9 10*3/uL (ref 1.7–7.7)
Neutrophils Relative %: 89 %
Platelets: 136 10*3/uL — ABNORMAL LOW (ref 150–400)
RBC: 3.32 MIL/uL — ABNORMAL LOW (ref 3.87–5.11)
RDW: 18 % — ABNORMAL HIGH (ref 11.5–15.5)
WBC: 4.4 10*3/uL (ref 4.0–10.5)
nRBC: 0 % (ref 0.0–0.2)

## 2023-03-28 LAB — BASIC METABOLIC PANEL
Anion gap: 9 (ref 5–15)
BUN: 9 mg/dL (ref 8–23)
CO2: 35 mmol/L — ABNORMAL HIGH (ref 22–32)
Calcium: 8 mg/dL — ABNORMAL LOW (ref 8.9–10.3)
Chloride: 88 mmol/L — ABNORMAL LOW (ref 98–111)
Creatinine, Ser: <0.3 mg/dL — ABNORMAL LOW (ref 0.44–1.00)
Glucose, Bld: 151 mg/dL — ABNORMAL HIGH (ref 70–99)
Potassium: 3 mmol/L — ABNORMAL LOW (ref 3.5–5.1)
Sodium: 132 mmol/L — ABNORMAL LOW (ref 135–145)

## 2023-03-28 LAB — MAGNESIUM: Magnesium: 1.7 mg/dL (ref 1.7–2.4)

## 2023-03-28 LAB — PHOSPHORUS: Phosphorus: 2.5 mg/dL (ref 2.5–4.6)

## 2023-03-28 LAB — TSH: TSH: 0.728 u[IU]/mL (ref 0.350–4.500)

## 2023-03-29 ENCOUNTER — Encounter: Payer: Self-pay | Admitting: Oncology

## 2023-03-29 ENCOUNTER — Other Ambulatory Visit (HOSPITAL_COMMUNITY): Payer: Medicare HMO

## 2023-03-29 DIAGNOSIS — J9601 Acute respiratory failure with hypoxia: Secondary | ICD-10-CM | POA: Diagnosis not present

## 2023-03-29 DIAGNOSIS — Y95 Nosocomial condition: Secondary | ICD-10-CM | POA: Diagnosis not present

## 2023-03-29 DIAGNOSIS — J9621 Acute and chronic respiratory failure with hypoxia: Secondary | ICD-10-CM | POA: Diagnosis not present

## 2023-03-29 DIAGNOSIS — J159 Unspecified bacterial pneumonia: Secondary | ICD-10-CM | POA: Diagnosis not present

## 2023-03-29 DIAGNOSIS — J42 Unspecified chronic bronchitis: Secondary | ICD-10-CM | POA: Diagnosis not present

## 2023-03-29 DIAGNOSIS — I10 Essential (primary) hypertension: Secondary | ICD-10-CM | POA: Diagnosis not present

## 2023-03-29 DIAGNOSIS — Z930 Tracheostomy status: Secondary | ICD-10-CM | POA: Diagnosis not present

## 2023-03-29 LAB — BASIC METABOLIC PANEL
Anion gap: 11 (ref 5–15)
BUN: 12 mg/dL (ref 8–23)
CO2: 37 mmol/L — ABNORMAL HIGH (ref 22–32)
Calcium: 8 mg/dL — ABNORMAL LOW (ref 8.9–10.3)
Chloride: 89 mmol/L — ABNORMAL LOW (ref 98–111)
Creatinine, Ser: 0.3 mg/dL — ABNORMAL LOW (ref 0.44–1.00)
GFR, Estimated: >60 mL/min (ref >60)
Glucose, Bld: 152 mg/dL — ABNORMAL HIGH (ref 70–99)
Potassium: 2.9 mmol/L — ABNORMAL LOW (ref 3.5–5.1)
Sodium: 137 mmol/L (ref 135–145)

## 2023-03-29 LAB — MAGNESIUM: Magnesium: 2 mg/dL (ref 1.7–2.4)

## 2023-03-30 DIAGNOSIS — J42 Unspecified chronic bronchitis: Secondary | ICD-10-CM | POA: Diagnosis not present

## 2023-03-30 DIAGNOSIS — J159 Unspecified bacterial pneumonia: Secondary | ICD-10-CM | POA: Diagnosis not present

## 2023-03-30 DIAGNOSIS — Y95 Nosocomial condition: Secondary | ICD-10-CM | POA: Diagnosis not present

## 2023-03-30 DIAGNOSIS — J9601 Acute respiratory failure with hypoxia: Secondary | ICD-10-CM | POA: Diagnosis not present

## 2023-03-30 DIAGNOSIS — J9621 Acute and chronic respiratory failure with hypoxia: Secondary | ICD-10-CM | POA: Diagnosis not present

## 2023-03-30 DIAGNOSIS — I10 Essential (primary) hypertension: Secondary | ICD-10-CM | POA: Diagnosis not present

## 2023-03-30 DIAGNOSIS — Z930 Tracheostomy status: Secondary | ICD-10-CM | POA: Diagnosis not present

## 2023-03-30 LAB — BASIC METABOLIC PANEL
Anion gap: 13 (ref 5–15)
BUN: 15 mg/dL (ref 8–23)
CO2: 33 mmol/L — ABNORMAL HIGH (ref 22–32)
Calcium: 7.6 mg/dL — ABNORMAL LOW (ref 8.9–10.3)
Chloride: 90 mmol/L — ABNORMAL LOW (ref 98–111)
Creatinine, Ser: 0.42 mg/dL — ABNORMAL LOW (ref 0.44–1.00)
GFR, Estimated: >60 mL/min (ref >60)
Glucose, Bld: 84 mg/dL (ref 70–99)
Potassium: 3.7 mmol/L (ref 3.5–5.1)
Sodium: 136 mmol/L (ref 135–145)

## 2023-03-31 DIAGNOSIS — J9621 Acute and chronic respiratory failure with hypoxia: Secondary | ICD-10-CM | POA: Diagnosis not present

## 2023-03-31 DIAGNOSIS — I10 Essential (primary) hypertension: Secondary | ICD-10-CM | POA: Diagnosis not present

## 2023-03-31 DIAGNOSIS — J9601 Acute respiratory failure with hypoxia: Secondary | ICD-10-CM | POA: Diagnosis not present

## 2023-03-31 DIAGNOSIS — Y95 Nosocomial condition: Secondary | ICD-10-CM | POA: Diagnosis not present

## 2023-03-31 DIAGNOSIS — J159 Unspecified bacterial pneumonia: Secondary | ICD-10-CM | POA: Diagnosis not present

## 2023-03-31 DIAGNOSIS — Z930 Tracheostomy status: Secondary | ICD-10-CM | POA: Diagnosis not present

## 2023-03-31 DIAGNOSIS — J42 Unspecified chronic bronchitis: Secondary | ICD-10-CM | POA: Diagnosis not present

## 2023-04-01 DIAGNOSIS — Y95 Nosocomial condition: Secondary | ICD-10-CM | POA: Diagnosis not present

## 2023-04-01 DIAGNOSIS — J42 Unspecified chronic bronchitis: Secondary | ICD-10-CM | POA: Diagnosis not present

## 2023-04-01 DIAGNOSIS — J159 Unspecified bacterial pneumonia: Secondary | ICD-10-CM | POA: Diagnosis not present

## 2023-04-01 DIAGNOSIS — Z930 Tracheostomy status: Secondary | ICD-10-CM | POA: Diagnosis not present

## 2023-04-01 DIAGNOSIS — J9601 Acute respiratory failure with hypoxia: Secondary | ICD-10-CM | POA: Diagnosis not present

## 2023-04-01 DIAGNOSIS — I10 Essential (primary) hypertension: Secondary | ICD-10-CM | POA: Diagnosis not present

## 2023-04-01 DIAGNOSIS — J9621 Acute and chronic respiratory failure with hypoxia: Secondary | ICD-10-CM | POA: Diagnosis not present

## 2023-04-01 LAB — CBC WITH DIFFERENTIAL/PLATELET
Abs Immature Granulocytes: 0.15 10*3/uL — ABNORMAL HIGH (ref 0.00–0.07)
Basophils Absolute: 0 10*3/uL (ref 0.0–0.1)
Basophils Relative: 0 %
Eosinophils Absolute: 0 10*3/uL (ref 0.0–0.5)
Eosinophils Relative: 0 %
HCT: 29.1 % — ABNORMAL LOW (ref 36.0–46.0)
Hemoglobin: 9 g/dL — ABNORMAL LOW (ref 12.0–15.0)
Immature Granulocytes: 2 %
Lymphocytes Relative: 5 %
Lymphs Abs: 0.4 10*3/uL — ABNORMAL LOW (ref 0.7–4.0)
MCH: 26.6 pg (ref 26.0–34.0)
MCHC: 30.9 g/dL (ref 30.0–36.0)
MCV: 86.1 fL (ref 80.0–100.0)
Monocytes Absolute: 0.7 10*3/uL (ref 0.1–1.0)
Monocytes Relative: 9 %
Neutro Abs: 6.5 10*3/uL (ref 1.7–7.7)
Neutrophils Relative %: 84 %
Platelets: 321 10*3/uL (ref 150–400)
RBC: 3.38 MIL/uL — ABNORMAL LOW (ref 3.87–5.11)
RDW: 18 % — ABNORMAL HIGH (ref 11.5–15.5)
WBC: 7.8 10*3/uL (ref 4.0–10.5)
nRBC: 0 % (ref 0.0–0.2)

## 2023-04-02 DIAGNOSIS — I10 Essential (primary) hypertension: Secondary | ICD-10-CM | POA: Diagnosis not present

## 2023-04-02 DIAGNOSIS — J9621 Acute and chronic respiratory failure with hypoxia: Secondary | ICD-10-CM | POA: Diagnosis not present

## 2023-04-02 DIAGNOSIS — Z930 Tracheostomy status: Secondary | ICD-10-CM | POA: Diagnosis not present

## 2023-04-02 DIAGNOSIS — J159 Unspecified bacterial pneumonia: Secondary | ICD-10-CM | POA: Diagnosis not present

## 2023-04-02 DIAGNOSIS — J42 Unspecified chronic bronchitis: Secondary | ICD-10-CM | POA: Diagnosis not present

## 2023-04-02 DIAGNOSIS — Y95 Nosocomial condition: Secondary | ICD-10-CM | POA: Diagnosis not present

## 2023-04-02 DIAGNOSIS — J9601 Acute respiratory failure with hypoxia: Secondary | ICD-10-CM | POA: Diagnosis not present

## 2023-04-03 DIAGNOSIS — J159 Unspecified bacterial pneumonia: Secondary | ICD-10-CM | POA: Diagnosis not present

## 2023-04-03 DIAGNOSIS — Z930 Tracheostomy status: Secondary | ICD-10-CM | POA: Diagnosis not present

## 2023-04-03 DIAGNOSIS — Y95 Nosocomial condition: Secondary | ICD-10-CM | POA: Diagnosis not present

## 2023-04-03 DIAGNOSIS — J9621 Acute and chronic respiratory failure with hypoxia: Secondary | ICD-10-CM | POA: Diagnosis not present

## 2023-04-03 DIAGNOSIS — J42 Unspecified chronic bronchitis: Secondary | ICD-10-CM | POA: Diagnosis not present

## 2023-04-03 DIAGNOSIS — J9601 Acute respiratory failure with hypoxia: Secondary | ICD-10-CM | POA: Diagnosis not present

## 2023-04-03 DIAGNOSIS — I10 Essential (primary) hypertension: Secondary | ICD-10-CM | POA: Diagnosis not present

## 2023-04-04 DIAGNOSIS — J9621 Acute and chronic respiratory failure with hypoxia: Secondary | ICD-10-CM | POA: Diagnosis not present

## 2023-04-04 DIAGNOSIS — Y95 Nosocomial condition: Secondary | ICD-10-CM | POA: Diagnosis not present

## 2023-04-04 DIAGNOSIS — I10 Essential (primary) hypertension: Secondary | ICD-10-CM | POA: Diagnosis not present

## 2023-04-04 DIAGNOSIS — J159 Unspecified bacterial pneumonia: Secondary | ICD-10-CM | POA: Diagnosis not present

## 2023-04-04 DIAGNOSIS — Z930 Tracheostomy status: Secondary | ICD-10-CM | POA: Diagnosis not present

## 2023-04-04 DIAGNOSIS — J42 Unspecified chronic bronchitis: Secondary | ICD-10-CM | POA: Diagnosis not present

## 2023-04-04 DIAGNOSIS — J9601 Acute respiratory failure with hypoxia: Secondary | ICD-10-CM | POA: Diagnosis not present

## 2023-04-04 LAB — COMPREHENSIVE METABOLIC PANEL
ALT: 31 U/L (ref 0–44)
AST: 30 U/L (ref 15–41)
Albumin: 2.1 g/dL — ABNORMAL LOW (ref 3.5–5.0)
Alkaline Phosphatase: 71 U/L (ref 38–126)
Anion gap: 7 (ref 5–15)
BUN: 14 mg/dL (ref 8–23)
CO2: 36 mmol/L — ABNORMAL HIGH (ref 22–32)
Calcium: 7.9 mg/dL — ABNORMAL LOW (ref 8.9–10.3)
Chloride: 88 mmol/L — ABNORMAL LOW (ref 98–111)
Creatinine, Ser: <0.3 mg/dL — ABNORMAL LOW (ref 0.44–1.00)
Glucose, Bld: 102 mg/dL — ABNORMAL HIGH (ref 70–99)
Potassium: 4.1 mmol/L (ref 3.5–5.1)
Sodium: 131 mmol/L — ABNORMAL LOW (ref 135–145)
Total Bilirubin: 0.5 mg/dL (ref <1.2)
Total Protein: 4.8 g/dL — ABNORMAL LOW (ref 6.5–8.1)

## 2023-04-04 LAB — CBC WITH DIFFERENTIAL/PLATELET
Abs Immature Granulocytes: 0.34 10*3/uL — ABNORMAL HIGH (ref 0.00–0.07)
Basophils Absolute: 0.1 10*3/uL (ref 0.0–0.1)
Basophils Relative: 1 %
Eosinophils Absolute: 0 10*3/uL (ref 0.0–0.5)
Eosinophils Relative: 0 %
HCT: 29.8 % — ABNORMAL LOW (ref 36.0–46.0)
Hemoglobin: 9.7 g/dL — ABNORMAL LOW (ref 12.0–15.0)
Immature Granulocytes: 3 %
Lymphocytes Relative: 7 %
Lymphs Abs: 0.9 10*3/uL (ref 0.7–4.0)
MCH: 27.9 pg (ref 26.0–34.0)
MCHC: 32.6 g/dL (ref 30.0–36.0)
MCV: 85.6 fL (ref 80.0–100.0)
Monocytes Absolute: 1.3 10*3/uL — ABNORMAL HIGH (ref 0.1–1.0)
Monocytes Relative: 10 %
Neutro Abs: 10.2 10*3/uL — ABNORMAL HIGH (ref 1.7–7.7)
Neutrophils Relative %: 79 %
Platelets: 358 10*3/uL (ref 150–400)
RBC: 3.48 MIL/uL — ABNORMAL LOW (ref 3.87–5.11)
RDW: 18.4 % — ABNORMAL HIGH (ref 11.5–15.5)
WBC: 12.9 10*3/uL — ABNORMAL HIGH (ref 4.0–10.5)
nRBC: 0 % (ref 0.0–0.2)

## 2023-04-05 DIAGNOSIS — Y95 Nosocomial condition: Secondary | ICD-10-CM | POA: Diagnosis not present

## 2023-04-05 DIAGNOSIS — I10 Essential (primary) hypertension: Secondary | ICD-10-CM | POA: Diagnosis not present

## 2023-04-05 DIAGNOSIS — J42 Unspecified chronic bronchitis: Secondary | ICD-10-CM | POA: Diagnosis not present

## 2023-04-05 DIAGNOSIS — J9621 Acute and chronic respiratory failure with hypoxia: Secondary | ICD-10-CM | POA: Diagnosis not present

## 2023-04-05 DIAGNOSIS — Z930 Tracheostomy status: Secondary | ICD-10-CM | POA: Diagnosis not present

## 2023-04-05 DIAGNOSIS — J159 Unspecified bacterial pneumonia: Secondary | ICD-10-CM | POA: Diagnosis not present

## 2023-04-05 DIAGNOSIS — J9601 Acute respiratory failure with hypoxia: Secondary | ICD-10-CM | POA: Diagnosis not present

## 2023-04-05 LAB — BASIC METABOLIC PANEL
Anion gap: 6 (ref 5–15)
BUN: 14 mg/dL (ref 8–23)
CO2: 40 mmol/L — ABNORMAL HIGH (ref 22–32)
Calcium: 8.2 mg/dL — ABNORMAL LOW (ref 8.9–10.3)
Chloride: 88 mmol/L — ABNORMAL LOW (ref 98–111)
Creatinine, Ser: <0.3 mg/dL — ABNORMAL LOW (ref 0.44–1.00)
Glucose, Bld: 103 mg/dL — ABNORMAL HIGH (ref 70–99)
Potassium: 3.9 mmol/L (ref 3.5–5.1)
Sodium: 134 mmol/L — ABNORMAL LOW (ref 135–145)

## 2023-04-06 DIAGNOSIS — J42 Unspecified chronic bronchitis: Secondary | ICD-10-CM

## 2023-04-06 DIAGNOSIS — J159 Unspecified bacterial pneumonia: Secondary | ICD-10-CM

## 2023-04-06 DIAGNOSIS — I10 Essential (primary) hypertension: Secondary | ICD-10-CM | POA: Diagnosis not present

## 2023-04-06 DIAGNOSIS — Z930 Tracheostomy status: Secondary | ICD-10-CM

## 2023-04-06 DIAGNOSIS — J9621 Acute and chronic respiratory failure with hypoxia: Secondary | ICD-10-CM

## 2023-04-06 DIAGNOSIS — J9601 Acute respiratory failure with hypoxia: Secondary | ICD-10-CM | POA: Diagnosis not present

## 2023-04-06 DIAGNOSIS — Y95 Nosocomial condition: Secondary | ICD-10-CM

## 2023-04-07 ENCOUNTER — Encounter: Payer: Self-pay | Admitting: Oncology

## 2023-04-07 DIAGNOSIS — Y95 Nosocomial condition: Secondary | ICD-10-CM

## 2023-04-07 DIAGNOSIS — J9621 Acute and chronic respiratory failure with hypoxia: Secondary | ICD-10-CM

## 2023-04-07 DIAGNOSIS — J42 Unspecified chronic bronchitis: Secondary | ICD-10-CM

## 2023-04-07 DIAGNOSIS — I10 Essential (primary) hypertension: Secondary | ICD-10-CM | POA: Diagnosis not present

## 2023-04-07 DIAGNOSIS — J159 Unspecified bacterial pneumonia: Secondary | ICD-10-CM

## 2023-04-07 DIAGNOSIS — Z930 Tracheostomy status: Secondary | ICD-10-CM

## 2023-04-07 DIAGNOSIS — J9601 Acute respiratory failure with hypoxia: Secondary | ICD-10-CM | POA: Diagnosis not present

## 2023-04-07 LAB — CBC WITH DIFFERENTIAL/PLATELET
Abs Immature Granulocytes: 0.17 10*3/uL — ABNORMAL HIGH (ref 0.00–0.07)
Basophils Absolute: 0 10*3/uL (ref 0.0–0.1)
Basophils Relative: 0 %
Eosinophils Absolute: 0 10*3/uL (ref 0.0–0.5)
Eosinophils Relative: 0 %
HCT: 29.8 % — ABNORMAL LOW (ref 36.0–46.0)
Hemoglobin: 9.3 g/dL — ABNORMAL LOW (ref 12.0–15.0)
Immature Granulocytes: 1 %
Lymphocytes Relative: 4 %
Lymphs Abs: 0.6 10*3/uL — ABNORMAL LOW (ref 0.7–4.0)
MCH: 27.7 pg (ref 26.0–34.0)
MCHC: 31.2 g/dL (ref 30.0–36.0)
MCV: 88.7 fL (ref 80.0–100.0)
Monocytes Absolute: 1.6 10*3/uL — ABNORMAL HIGH (ref 0.1–1.0)
Monocytes Relative: 11 %
Neutro Abs: 11.9 10*3/uL — ABNORMAL HIGH (ref 1.7–7.7)
Neutrophils Relative %: 84 %
Platelets: 362 10*3/uL (ref 150–400)
RBC: 3.36 MIL/uL — ABNORMAL LOW (ref 3.87–5.11)
RDW: 18.2 % — ABNORMAL HIGH (ref 11.5–15.5)
WBC: 14.4 10*3/uL — ABNORMAL HIGH (ref 4.0–10.5)
nRBC: 0 % (ref 0.0–0.2)

## 2023-04-07 LAB — BASIC METABOLIC PANEL
Anion gap: 8 (ref 5–15)
BUN: 23 mg/dL (ref 8–23)
CO2: 37 mmol/L — ABNORMAL HIGH (ref 22–32)
Calcium: 8.4 mg/dL — ABNORMAL LOW (ref 8.9–10.3)
Chloride: 90 mmol/L — ABNORMAL LOW (ref 98–111)
Creatinine, Ser: 0.31 mg/dL — ABNORMAL LOW (ref 0.44–1.00)
GFR, Estimated: >60 mL/min (ref >60)
Glucose, Bld: 118 mg/dL — ABNORMAL HIGH (ref 70–99)
Potassium: 5.1 mmol/L (ref 3.5–5.1)
Sodium: 135 mmol/L (ref 135–145)

## 2023-04-08 DIAGNOSIS — I10 Essential (primary) hypertension: Secondary | ICD-10-CM | POA: Diagnosis not present

## 2023-04-08 DIAGNOSIS — Z930 Tracheostomy status: Secondary | ICD-10-CM

## 2023-04-08 DIAGNOSIS — J9601 Acute respiratory failure with hypoxia: Secondary | ICD-10-CM | POA: Diagnosis not present

## 2023-04-08 DIAGNOSIS — J9621 Acute and chronic respiratory failure with hypoxia: Secondary | ICD-10-CM

## 2023-04-08 DIAGNOSIS — Y95 Nosocomial condition: Secondary | ICD-10-CM

## 2023-04-08 DIAGNOSIS — J159 Unspecified bacterial pneumonia: Secondary | ICD-10-CM

## 2023-04-08 DIAGNOSIS — J42 Unspecified chronic bronchitis: Secondary | ICD-10-CM

## 2023-04-08 LAB — POTASSIUM: Potassium: 3.8 mmol/L (ref 3.5–5.1)

## 2023-04-09 DIAGNOSIS — J9601 Acute respiratory failure with hypoxia: Secondary | ICD-10-CM | POA: Diagnosis not present

## 2023-04-09 DIAGNOSIS — I10 Essential (primary) hypertension: Secondary | ICD-10-CM | POA: Diagnosis not present

## 2023-04-10 DIAGNOSIS — J42 Unspecified chronic bronchitis: Secondary | ICD-10-CM

## 2023-04-10 DIAGNOSIS — J9621 Acute and chronic respiratory failure with hypoxia: Secondary | ICD-10-CM

## 2023-04-10 DIAGNOSIS — Z930 Tracheostomy status: Secondary | ICD-10-CM

## 2023-04-10 DIAGNOSIS — J9601 Acute respiratory failure with hypoxia: Secondary | ICD-10-CM | POA: Diagnosis not present

## 2023-04-10 DIAGNOSIS — I10 Essential (primary) hypertension: Secondary | ICD-10-CM | POA: Diagnosis not present

## 2023-04-10 DIAGNOSIS — Y95 Nosocomial condition: Secondary | ICD-10-CM

## 2023-04-10 DIAGNOSIS — J159 Unspecified bacterial pneumonia: Secondary | ICD-10-CM

## 2023-04-11 DIAGNOSIS — J42 Unspecified chronic bronchitis: Secondary | ICD-10-CM | POA: Diagnosis not present

## 2023-04-11 DIAGNOSIS — Z930 Tracheostomy status: Secondary | ICD-10-CM | POA: Diagnosis not present

## 2023-04-11 DIAGNOSIS — J9621 Acute and chronic respiratory failure with hypoxia: Secondary | ICD-10-CM | POA: Diagnosis not present

## 2023-04-11 DIAGNOSIS — J159 Unspecified bacterial pneumonia: Secondary | ICD-10-CM

## 2023-04-11 DIAGNOSIS — Y95 Nosocomial condition: Secondary | ICD-10-CM | POA: Diagnosis not present

## 2023-04-11 LAB — CBC WITH DIFFERENTIAL/PLATELET
Abs Immature Granulocytes: 0.08 10*3/uL — ABNORMAL HIGH (ref 0.00–0.07)
Basophils Absolute: 0.1 10*3/uL (ref 0.0–0.1)
Basophils Relative: 0 %
Eosinophils Absolute: 0 10*3/uL (ref 0.0–0.5)
Eosinophils Relative: 0 %
HCT: 30.6 % — ABNORMAL LOW (ref 36.0–46.0)
Hemoglobin: 9.3 g/dL — ABNORMAL LOW (ref 12.0–15.0)
Immature Granulocytes: 1 %
Lymphocytes Relative: 4 %
Lymphs Abs: 0.5 10*3/uL — ABNORMAL LOW (ref 0.7–4.0)
MCH: 27 pg (ref 26.0–34.0)
MCHC: 30.4 g/dL (ref 30.0–36.0)
MCV: 89 fL (ref 80.0–100.0)
Monocytes Absolute: 1.2 10*3/uL — ABNORMAL HIGH (ref 0.1–1.0)
Monocytes Relative: 10 %
Neutro Abs: 10.6 10*3/uL — ABNORMAL HIGH (ref 1.7–7.7)
Neutrophils Relative %: 85 %
Platelets: 269 10*3/uL (ref 150–400)
RBC: 3.44 MIL/uL — ABNORMAL LOW (ref 3.87–5.11)
RDW: 17.9 % — ABNORMAL HIGH (ref 11.5–15.5)
WBC: 12.6 10*3/uL — ABNORMAL HIGH (ref 4.0–10.5)
nRBC: 0 % (ref 0.0–0.2)

## 2023-04-11 LAB — COMPREHENSIVE METABOLIC PANEL
ALT: 22 U/L (ref 0–44)
AST: 21 U/L (ref 15–41)
Albumin: 2.2 g/dL — ABNORMAL LOW (ref 3.5–5.0)
Alkaline Phosphatase: 67 U/L (ref 38–126)
Anion gap: 8 (ref 5–15)
BUN: 19 mg/dL (ref 8–23)
CO2: 36 mmol/L — ABNORMAL HIGH (ref 22–32)
Calcium: 8.2 mg/dL — ABNORMAL LOW (ref 8.9–10.3)
Chloride: 91 mmol/L — ABNORMAL LOW (ref 98–111)
Creatinine, Ser: <0.3 mg/dL — ABNORMAL LOW (ref 0.44–1.00)
Glucose, Bld: 117 mg/dL — ABNORMAL HIGH (ref 70–99)
Potassium: 3.6 mmol/L (ref 3.5–5.1)
Sodium: 135 mmol/L (ref 135–145)
Total Bilirubin: 0.5 mg/dL (ref <1.2)
Total Protein: 5.4 g/dL — ABNORMAL LOW (ref 6.5–8.1)

## 2023-04-12 ENCOUNTER — Encounter: Payer: Self-pay | Admitting: Oncology

## 2023-04-12 DIAGNOSIS — Z930 Tracheostomy status: Secondary | ICD-10-CM | POA: Diagnosis not present

## 2023-04-12 DIAGNOSIS — J159 Unspecified bacterial pneumonia: Secondary | ICD-10-CM

## 2023-04-12 DIAGNOSIS — J42 Unspecified chronic bronchitis: Secondary | ICD-10-CM | POA: Diagnosis not present

## 2023-04-12 DIAGNOSIS — Y95 Nosocomial condition: Secondary | ICD-10-CM | POA: Diagnosis not present

## 2023-04-12 DIAGNOSIS — J9621 Acute and chronic respiratory failure with hypoxia: Secondary | ICD-10-CM | POA: Diagnosis not present

## 2023-04-13 ENCOUNTER — Institutional Professional Consult (permissible substitution) (HOSPITAL_COMMUNITY): Payer: Medicare HMO

## 2023-04-13 DIAGNOSIS — Z930 Tracheostomy status: Secondary | ICD-10-CM | POA: Diagnosis not present

## 2023-04-13 DIAGNOSIS — J42 Unspecified chronic bronchitis: Secondary | ICD-10-CM | POA: Diagnosis not present

## 2023-04-13 DIAGNOSIS — J9621 Acute and chronic respiratory failure with hypoxia: Secondary | ICD-10-CM | POA: Diagnosis not present

## 2023-04-13 DIAGNOSIS — Y95 Nosocomial condition: Secondary | ICD-10-CM | POA: Diagnosis not present

## 2023-04-13 DIAGNOSIS — J159 Unspecified bacterial pneumonia: Secondary | ICD-10-CM

## 2023-04-13 LAB — BASIC METABOLIC PANEL
Anion gap: 7 (ref 5–15)
BUN: 23 mg/dL (ref 8–23)
CO2: 34 mmol/L — ABNORMAL HIGH (ref 22–32)
Calcium: 8.3 mg/dL — ABNORMAL LOW (ref 8.9–10.3)
Chloride: 93 mmol/L — ABNORMAL LOW (ref 98–111)
Creatinine, Ser: 0.33 mg/dL — ABNORMAL LOW (ref 0.44–1.00)
GFR, Estimated: >60 mL/min (ref >60)
Glucose, Bld: 102 mg/dL — ABNORMAL HIGH (ref 70–99)
Potassium: 4.5 mmol/L (ref 3.5–5.1)
Sodium: 134 mmol/L — ABNORMAL LOW (ref 135–145)

## 2023-04-13 LAB — BLOOD GAS, ARTERIAL
Acid-Base Excess: 16.6 mmol/L — ABNORMAL HIGH (ref 0.0–2.0)
Bicarbonate: 41.8 mmol/L — ABNORMAL HIGH (ref 20.0–28.0)
O2 Saturation: 99.5 %
Patient temperature: 36.1
pCO2 arterial: 48 mm[Hg] (ref 32–48)
pH, Arterial: 7.54 — ABNORMAL HIGH (ref 7.35–7.45)
pO2, Arterial: 96 mm[Hg] (ref 83–108)

## 2023-04-13 LAB — CBC WITH DIFFERENTIAL/PLATELET
Abs Immature Granulocytes: 0.06 10*3/uL (ref 0.00–0.07)
Basophils Absolute: 0.1 10*3/uL (ref 0.0–0.1)
Basophils Relative: 1 %
Eosinophils Absolute: 0.1 10*3/uL (ref 0.0–0.5)
Eosinophils Relative: 1 %
HCT: 32.4 % — ABNORMAL LOW (ref 36.0–46.0)
Hemoglobin: 10 g/dL — ABNORMAL LOW (ref 12.0–15.0)
Immature Granulocytes: 1 %
Lymphocytes Relative: 6 %
Lymphs Abs: 0.7 10*3/uL (ref 0.7–4.0)
MCH: 27.7 pg (ref 26.0–34.0)
MCHC: 30.9 g/dL (ref 30.0–36.0)
MCV: 89.8 fL (ref 80.0–100.0)
Monocytes Absolute: 1.1 10*3/uL — ABNORMAL HIGH (ref 0.1–1.0)
Monocytes Relative: 9 %
Neutro Abs: 10.3 10*3/uL — ABNORMAL HIGH (ref 1.7–7.7)
Neutrophils Relative %: 82 %
Platelets: 227 10*3/uL (ref 150–400)
RBC: 3.61 MIL/uL — ABNORMAL LOW (ref 3.87–5.11)
RDW: 18.2 % — ABNORMAL HIGH (ref 11.5–15.5)
WBC: 12.3 10*3/uL — ABNORMAL HIGH (ref 4.0–10.5)
nRBC: 0 % (ref 0.0–0.2)

## 2023-04-13 LAB — HEPATIC FUNCTION PANEL
ALT: 24 U/L (ref 0–44)
AST: 21 U/L (ref 15–41)
Albumin: 2.3 g/dL — ABNORMAL LOW (ref 3.5–5.0)
Alkaline Phosphatase: 78 U/L (ref 38–126)
Bilirubin, Direct: 0.1 mg/dL (ref 0.0–0.2)
Indirect Bilirubin: 0.3 mg/dL (ref 0.3–0.9)
Total Bilirubin: 0.4 mg/dL (ref <1.2)
Total Protein: 5.7 g/dL — ABNORMAL LOW (ref 6.5–8.1)

## 2023-04-13 LAB — MAGNESIUM: Magnesium: 2 mg/dL (ref 1.7–2.4)

## 2023-04-13 LAB — LACTIC ACID, PLASMA: Lactic Acid, Venous: 1.2 mmol/L (ref 0.5–1.9)

## 2023-04-13 LAB — PHOSPHORUS: Phosphorus: 2.9 mg/dL (ref 2.5–4.6)

## 2023-04-14 ENCOUNTER — Institutional Professional Consult (permissible substitution) (HOSPITAL_COMMUNITY): Payer: Medicare HMO

## 2023-04-14 DIAGNOSIS — J42 Unspecified chronic bronchitis: Secondary | ICD-10-CM | POA: Diagnosis not present

## 2023-04-14 DIAGNOSIS — Z930 Tracheostomy status: Secondary | ICD-10-CM | POA: Diagnosis not present

## 2023-04-14 DIAGNOSIS — J159 Unspecified bacterial pneumonia: Secondary | ICD-10-CM

## 2023-04-14 DIAGNOSIS — J9621 Acute and chronic respiratory failure with hypoxia: Secondary | ICD-10-CM | POA: Diagnosis not present

## 2023-04-14 DIAGNOSIS — Y95 Nosocomial condition: Secondary | ICD-10-CM | POA: Diagnosis not present

## 2023-04-14 LAB — CBC WITH DIFFERENTIAL/PLATELET
Abs Immature Granulocytes: 0.06 10*3/uL (ref 0.00–0.07)
Basophils Absolute: 0.1 10*3/uL (ref 0.0–0.1)
Basophils Relative: 0 %
Eosinophils Absolute: 0.1 10*3/uL (ref 0.0–0.5)
Eosinophils Relative: 1 %
HCT: 31.7 % — ABNORMAL LOW (ref 36.0–46.0)
Hemoglobin: 9.7 g/dL — ABNORMAL LOW (ref 12.0–15.0)
Immature Granulocytes: 1 %
Lymphocytes Relative: 5 %
Lymphs Abs: 0.5 10*3/uL — ABNORMAL LOW (ref 0.7–4.0)
MCH: 27.6 pg (ref 26.0–34.0)
MCHC: 30.6 g/dL (ref 30.0–36.0)
MCV: 90.1 fL (ref 80.0–100.0)
Monocytes Absolute: 0.7 10*3/uL (ref 0.1–1.0)
Monocytes Relative: 6 %
Neutro Abs: 9.7 10*3/uL — ABNORMAL HIGH (ref 1.7–7.7)
Neutrophils Relative %: 87 %
Platelets: 208 10*3/uL (ref 150–400)
RBC: 3.52 MIL/uL — ABNORMAL LOW (ref 3.87–5.11)
RDW: 18.1 % — ABNORMAL HIGH (ref 11.5–15.5)
WBC: 11.2 10*3/uL — ABNORMAL HIGH (ref 4.0–10.5)
nRBC: 0 % (ref 0.0–0.2)

## 2023-04-14 LAB — BASIC METABOLIC PANEL
Anion gap: 6 (ref 5–15)
BUN: 21 mg/dL (ref 8–23)
CO2: 35 mmol/L — ABNORMAL HIGH (ref 22–32)
Calcium: 8.7 mg/dL — ABNORMAL LOW (ref 8.9–10.3)
Chloride: 95 mmol/L — ABNORMAL LOW (ref 98–111)
Creatinine, Ser: 0.34 mg/dL — ABNORMAL LOW (ref 0.44–1.00)
GFR, Estimated: >60 mL/min (ref >60)
Glucose, Bld: 110 mg/dL — ABNORMAL HIGH (ref 70–99)
Potassium: 5.7 mmol/L — ABNORMAL HIGH (ref 3.5–5.1)
Sodium: 136 mmol/L (ref 135–145)

## 2023-04-14 LAB — PHOSPHORUS: Phosphorus: 3.2 mg/dL (ref 2.5–4.6)

## 2023-04-14 LAB — MAGNESIUM: Magnesium: 2.1 mg/dL (ref 1.7–2.4)

## 2023-04-15 ENCOUNTER — Institutional Professional Consult (permissible substitution) (HOSPITAL_COMMUNITY): Payer: Medicare HMO

## 2023-04-15 LAB — CBC WITH DIFFERENTIAL/PLATELET
Abs Immature Granulocytes: 0.04 10*3/uL (ref 0.00–0.07)
Basophils Absolute: 0.1 10*3/uL (ref 0.0–0.1)
Basophils Relative: 1 %
Eosinophils Absolute: 0.1 10*3/uL (ref 0.0–0.5)
Eosinophils Relative: 1 %
HCT: 34.6 % — ABNORMAL LOW (ref 36.0–46.0)
Hemoglobin: 10.7 g/dL — ABNORMAL LOW (ref 12.0–15.0)
Immature Granulocytes: 1 %
Lymphocytes Relative: 7 %
Lymphs Abs: 0.5 10*3/uL — ABNORMAL LOW (ref 0.7–4.0)
MCH: 27.1 pg (ref 26.0–34.0)
MCHC: 30.9 g/dL (ref 30.0–36.0)
MCV: 87.6 fL (ref 80.0–100.0)
Monocytes Absolute: 0.5 10*3/uL (ref 0.1–1.0)
Monocytes Relative: 6 %
Neutro Abs: 6.5 10*3/uL (ref 1.7–7.7)
Neutrophils Relative %: 84 %
Platelets: 193 10*3/uL (ref 150–400)
RBC: 3.95 MIL/uL (ref 3.87–5.11)
RDW: 18.2 % — ABNORMAL HIGH (ref 11.5–15.5)
WBC: 7.7 10*3/uL (ref 4.0–10.5)
nRBC: 0 % (ref 0.0–0.2)

## 2023-04-15 LAB — BASIC METABOLIC PANEL
Anion gap: 8 (ref 5–15)
BUN: 14 mg/dL (ref 8–23)
CO2: 34 mmol/L — ABNORMAL HIGH (ref 22–32)
Calcium: 9.2 mg/dL (ref 8.9–10.3)
Chloride: 94 mmol/L — ABNORMAL LOW (ref 98–111)
Creatinine, Ser: 0.37 mg/dL — ABNORMAL LOW (ref 0.44–1.00)
GFR, Estimated: >60 mL/min (ref >60)
Glucose, Bld: 119 mg/dL — ABNORMAL HIGH (ref 70–99)
Potassium: 6 mmol/L — ABNORMAL HIGH (ref 3.5–5.1)
Sodium: 136 mmol/L (ref 135–145)

## 2023-04-15 LAB — MAGNESIUM: Magnesium: 2 mg/dL (ref 1.7–2.4)

## 2023-04-15 LAB — PHOSPHORUS: Phosphorus: 3.9 mg/dL (ref 2.5–4.6)

## 2023-04-16 DIAGNOSIS — J159 Unspecified bacterial pneumonia: Secondary | ICD-10-CM

## 2023-04-16 DIAGNOSIS — J9621 Acute and chronic respiratory failure with hypoxia: Secondary | ICD-10-CM

## 2023-04-16 DIAGNOSIS — Z930 Tracheostomy status: Secondary | ICD-10-CM

## 2023-04-16 DIAGNOSIS — J42 Unspecified chronic bronchitis: Secondary | ICD-10-CM

## 2023-04-16 DIAGNOSIS — Y95 Nosocomial condition: Secondary | ICD-10-CM

## 2023-04-16 LAB — BASIC METABOLIC PANEL
Anion gap: 7 (ref 5–15)
BUN: 14 mg/dL (ref 8–23)
CO2: 32 mmol/L (ref 22–32)
Calcium: 8.3 mg/dL — ABNORMAL LOW (ref 8.9–10.3)
Chloride: 94 mmol/L — ABNORMAL LOW (ref 98–111)
Creatinine, Ser: 0.3 mg/dL — ABNORMAL LOW (ref 0.44–1.00)
GFR, Estimated: >60 mL/min (ref >60)
Glucose, Bld: 95 mg/dL (ref 70–99)
Potassium: 3.6 mmol/L (ref 3.5–5.1)
Sodium: 133 mmol/L — ABNORMAL LOW (ref 135–145)

## 2023-04-16 LAB — CBC WITH DIFFERENTIAL/PLATELET
Abs Immature Granulocytes: 0.03 10*3/uL (ref 0.00–0.07)
Basophils Absolute: 0 10*3/uL (ref 0.0–0.1)
Basophils Relative: 0 %
Eosinophils Absolute: 0.1 10*3/uL (ref 0.0–0.5)
Eosinophils Relative: 2 %
HCT: 31.7 % — ABNORMAL LOW (ref 36.0–46.0)
Hemoglobin: 9.9 g/dL — ABNORMAL LOW (ref 12.0–15.0)
Immature Granulocytes: 0 %
Lymphocytes Relative: 9 %
Lymphs Abs: 0.7 10*3/uL (ref 0.7–4.0)
MCH: 27.3 pg (ref 26.0–34.0)
MCHC: 31.2 g/dL (ref 30.0–36.0)
MCV: 87.3 fL (ref 80.0–100.0)
Monocytes Absolute: 0.5 10*3/uL (ref 0.1–1.0)
Monocytes Relative: 7 %
Neutro Abs: 5.9 10*3/uL (ref 1.7–7.7)
Neutrophils Relative %: 82 %
Platelets: 159 10*3/uL (ref 150–400)
RBC: 3.63 MIL/uL — ABNORMAL LOW (ref 3.87–5.11)
RDW: 18.1 % — ABNORMAL HIGH (ref 11.5–15.5)
WBC: 7.3 10*3/uL (ref 4.0–10.5)
nRBC: 0 % (ref 0.0–0.2)

## 2023-04-16 LAB — MAGNESIUM: Magnesium: 1.9 mg/dL (ref 1.7–2.4)

## 2023-04-16 LAB — PHOSPHORUS: Phosphorus: 3.6 mg/dL (ref 2.5–4.6)

## 2023-04-17 DIAGNOSIS — Y95 Nosocomial condition: Secondary | ICD-10-CM

## 2023-04-17 DIAGNOSIS — J9621 Acute and chronic respiratory failure with hypoxia: Secondary | ICD-10-CM

## 2023-04-17 DIAGNOSIS — J42 Unspecified chronic bronchitis: Secondary | ICD-10-CM

## 2023-04-17 DIAGNOSIS — J159 Unspecified bacterial pneumonia: Secondary | ICD-10-CM

## 2023-04-17 DIAGNOSIS — Z930 Tracheostomy status: Secondary | ICD-10-CM

## 2023-04-18 ENCOUNTER — Institutional Professional Consult (permissible substitution) (HOSPITAL_COMMUNITY): Payer: Medicare HMO

## 2023-04-18 DIAGNOSIS — J9621 Acute and chronic respiratory failure with hypoxia: Secondary | ICD-10-CM

## 2023-04-18 DIAGNOSIS — J42 Unspecified chronic bronchitis: Secondary | ICD-10-CM

## 2023-04-18 DIAGNOSIS — Y95 Nosocomial condition: Secondary | ICD-10-CM

## 2023-04-18 DIAGNOSIS — J159 Unspecified bacterial pneumonia: Secondary | ICD-10-CM

## 2023-04-18 DIAGNOSIS — Z930 Tracheostomy status: Secondary | ICD-10-CM

## 2023-04-19 DIAGNOSIS — J9621 Acute and chronic respiratory failure with hypoxia: Secondary | ICD-10-CM

## 2023-04-19 DIAGNOSIS — J159 Unspecified bacterial pneumonia: Secondary | ICD-10-CM

## 2023-04-19 DIAGNOSIS — Z930 Tracheostomy status: Secondary | ICD-10-CM

## 2023-04-19 DIAGNOSIS — Y95 Nosocomial condition: Secondary | ICD-10-CM

## 2023-04-19 DIAGNOSIS — J42 Unspecified chronic bronchitis: Secondary | ICD-10-CM

## 2023-04-19 LAB — BASIC METABOLIC PANEL
Anion gap: 7 (ref 5–15)
BUN: 34 mg/dL — ABNORMAL HIGH (ref 8–23)
CO2: 36 mmol/L — ABNORMAL HIGH (ref 22–32)
Calcium: 8.6 mg/dL — ABNORMAL LOW (ref 8.9–10.3)
Chloride: 93 mmol/L — ABNORMAL LOW (ref 98–111)
Creatinine, Ser: 0.37 mg/dL — ABNORMAL LOW (ref 0.44–1.00)
GFR, Estimated: >60 mL/min (ref >60)
Glucose, Bld: 91 mg/dL (ref 70–99)
Potassium: 4.1 mmol/L (ref 3.5–5.1)
Sodium: 136 mmol/L (ref 135–145)

## 2023-04-19 LAB — CBC WITH DIFFERENTIAL/PLATELET
Abs Immature Granulocytes: 0.04 10*3/uL (ref 0.00–0.07)
Basophils Absolute: 0 10*3/uL (ref 0.0–0.1)
Basophils Relative: 1 %
Eosinophils Absolute: 0.2 10*3/uL (ref 0.0–0.5)
Eosinophils Relative: 2 %
HCT: 33.8 % — ABNORMAL LOW (ref 36.0–46.0)
Hemoglobin: 10.5 g/dL — ABNORMAL LOW (ref 12.0–15.0)
Immature Granulocytes: 1 %
Lymphocytes Relative: 10 %
Lymphs Abs: 0.7 10*3/uL (ref 0.7–4.0)
MCH: 27.8 pg (ref 26.0–34.0)
MCHC: 31.1 g/dL (ref 30.0–36.0)
MCV: 89.4 fL (ref 80.0–100.0)
Monocytes Absolute: 0.6 10*3/uL (ref 0.1–1.0)
Monocytes Relative: 8 %
Neutro Abs: 5.9 10*3/uL (ref 1.7–7.7)
Neutrophils Relative %: 78 %
Platelets: 141 10*3/uL — ABNORMAL LOW (ref 150–400)
RBC: 3.78 MIL/uL — ABNORMAL LOW (ref 3.87–5.11)
RDW: 18.6 % — ABNORMAL HIGH (ref 11.5–15.5)
WBC: 7.5 10*3/uL (ref 4.0–10.5)
nRBC: 0 % (ref 0.0–0.2)

## 2023-04-19 LAB — PHOSPHORUS: Phosphorus: 2.6 mg/dL (ref 2.5–4.6)

## 2023-04-19 LAB — MAGNESIUM: Magnesium: 2 mg/dL (ref 1.7–2.4)

## 2023-04-20 DIAGNOSIS — J42 Unspecified chronic bronchitis: Secondary | ICD-10-CM

## 2023-04-20 DIAGNOSIS — J159 Unspecified bacterial pneumonia: Secondary | ICD-10-CM

## 2023-04-20 DIAGNOSIS — Y95 Nosocomial condition: Secondary | ICD-10-CM

## 2023-04-20 DIAGNOSIS — Z930 Tracheostomy status: Secondary | ICD-10-CM

## 2023-04-20 DIAGNOSIS — J9621 Acute and chronic respiratory failure with hypoxia: Secondary | ICD-10-CM

## 2023-04-21 DIAGNOSIS — J42 Unspecified chronic bronchitis: Secondary | ICD-10-CM

## 2023-04-21 DIAGNOSIS — Y95 Nosocomial condition: Secondary | ICD-10-CM

## 2023-04-21 DIAGNOSIS — J159 Unspecified bacterial pneumonia: Secondary | ICD-10-CM

## 2023-04-21 DIAGNOSIS — Z930 Tracheostomy status: Secondary | ICD-10-CM

## 2023-04-21 DIAGNOSIS — J9621 Acute and chronic respiratory failure with hypoxia: Secondary | ICD-10-CM

## 2023-04-22 DIAGNOSIS — J9601 Acute respiratory failure with hypoxia: Secondary | ICD-10-CM | POA: Diagnosis not present

## 2023-04-22 DIAGNOSIS — R0602 Shortness of breath: Secondary | ICD-10-CM | POA: Diagnosis not present

## 2023-04-22 DIAGNOSIS — J449 Chronic obstructive pulmonary disease, unspecified: Secondary | ICD-10-CM | POA: Diagnosis not present

## 2023-04-22 DIAGNOSIS — J42 Unspecified chronic bronchitis: Secondary | ICD-10-CM | POA: Diagnosis not present

## 2023-04-22 DIAGNOSIS — R0989 Other specified symptoms and signs involving the circulatory and respiratory systems: Secondary | ICD-10-CM | POA: Diagnosis not present

## 2023-04-22 DIAGNOSIS — Z4682 Encounter for fitting and adjustment of non-vascular catheter: Secondary | ICD-10-CM | POA: Diagnosis not present

## 2023-04-22 DIAGNOSIS — Z931 Gastrostomy status: Secondary | ICD-10-CM | POA: Diagnosis not present

## 2023-04-22 DIAGNOSIS — R531 Weakness: Secondary | ICD-10-CM | POA: Diagnosis not present

## 2023-04-22 DIAGNOSIS — E0500 Thyrotoxicosis with diffuse goiter without thyrotoxic crisis or storm: Secondary | ICD-10-CM | POA: Diagnosis not present

## 2023-04-22 DIAGNOSIS — J441 Chronic obstructive pulmonary disease with (acute) exacerbation: Secondary | ICD-10-CM | POA: Diagnosis not present

## 2023-04-22 DIAGNOSIS — E0520 Thyrotoxicosis with toxic multinodular goiter without thyrotoxic crisis or storm: Secondary | ICD-10-CM | POA: Diagnosis not present

## 2023-04-22 DIAGNOSIS — J9621 Acute and chronic respiratory failure with hypoxia: Secondary | ICD-10-CM | POA: Diagnosis not present

## 2023-04-22 DIAGNOSIS — R1310 Dysphagia, unspecified: Secondary | ICD-10-CM | POA: Diagnosis not present

## 2023-04-22 DIAGNOSIS — E43 Unspecified severe protein-calorie malnutrition: Secondary | ICD-10-CM | POA: Diagnosis not present

## 2023-04-22 DIAGNOSIS — R1312 Dysphagia, oropharyngeal phase: Secondary | ICD-10-CM | POA: Diagnosis not present

## 2023-04-22 DIAGNOSIS — E875 Hyperkalemia: Secondary | ICD-10-CM | POA: Diagnosis not present

## 2023-04-22 DIAGNOSIS — I10 Essential (primary) hypertension: Secondary | ICD-10-CM | POA: Diagnosis not present

## 2023-04-22 DIAGNOSIS — C50919 Malignant neoplasm of unspecified site of unspecified female breast: Secondary | ICD-10-CM | POA: Diagnosis not present

## 2023-04-22 DIAGNOSIS — J398 Other specified diseases of upper respiratory tract: Secondary | ICD-10-CM | POA: Diagnosis not present

## 2023-04-22 DIAGNOSIS — E0501 Thyrotoxicosis with diffuse goiter with thyrotoxic crisis or storm: Secondary | ICD-10-CM | POA: Diagnosis not present

## 2023-04-22 DIAGNOSIS — D696 Thrombocytopenia, unspecified: Secondary | ICD-10-CM | POA: Diagnosis not present

## 2023-04-22 DIAGNOSIS — E785 Hyperlipidemia, unspecified: Secondary | ICD-10-CM | POA: Diagnosis not present

## 2023-04-22 DIAGNOSIS — C50912 Malignant neoplasm of unspecified site of left female breast: Secondary | ICD-10-CM | POA: Diagnosis not present

## 2023-04-22 DIAGNOSIS — Y95 Nosocomial condition: Secondary | ICD-10-CM | POA: Diagnosis not present

## 2023-04-22 DIAGNOSIS — Z743 Need for continuous supervision: Secondary | ICD-10-CM | POA: Diagnosis not present

## 2023-04-22 DIAGNOSIS — Z930 Tracheostomy status: Secondary | ICD-10-CM | POA: Diagnosis not present

## 2023-04-22 DIAGNOSIS — R5381 Other malaise: Secondary | ICD-10-CM | POA: Diagnosis not present

## 2023-04-22 DIAGNOSIS — J159 Unspecified bacterial pneumonia: Secondary | ICD-10-CM | POA: Diagnosis not present

## 2023-04-22 DIAGNOSIS — Z430 Encounter for attention to tracheostomy: Secondary | ICD-10-CM | POA: Diagnosis not present

## 2023-04-23 LAB — CULTURE, RESPIRATORY W GRAM STAIN

## 2023-04-24 DIAGNOSIS — J9601 Acute respiratory failure with hypoxia: Secondary | ICD-10-CM | POA: Diagnosis not present

## 2023-04-24 DIAGNOSIS — J449 Chronic obstructive pulmonary disease, unspecified: Secondary | ICD-10-CM | POA: Diagnosis not present

## 2023-04-24 DIAGNOSIS — E43 Unspecified severe protein-calorie malnutrition: Secondary | ICD-10-CM | POA: Diagnosis not present

## 2023-04-24 DIAGNOSIS — R5381 Other malaise: Secondary | ICD-10-CM | POA: Diagnosis not present

## 2023-04-24 DIAGNOSIS — C50912 Malignant neoplasm of unspecified site of left female breast: Secondary | ICD-10-CM | POA: Diagnosis not present

## 2023-04-28 DIAGNOSIS — Z430 Encounter for attention to tracheostomy: Secondary | ICD-10-CM | POA: Diagnosis not present

## 2023-04-28 DIAGNOSIS — E0500 Thyrotoxicosis with diffuse goiter without thyrotoxic crisis or storm: Secondary | ICD-10-CM | POA: Diagnosis not present

## 2023-04-28 DIAGNOSIS — Z931 Gastrostomy status: Secondary | ICD-10-CM | POA: Diagnosis not present

## 2023-04-28 DIAGNOSIS — R1312 Dysphagia, oropharyngeal phase: Secondary | ICD-10-CM | POA: Diagnosis not present

## 2023-04-28 DIAGNOSIS — E785 Hyperlipidemia, unspecified: Secondary | ICD-10-CM | POA: Diagnosis not present

## 2023-04-28 DIAGNOSIS — J441 Chronic obstructive pulmonary disease with (acute) exacerbation: Secondary | ICD-10-CM | POA: Diagnosis not present

## 2023-04-28 DIAGNOSIS — C50912 Malignant neoplasm of unspecified site of left female breast: Secondary | ICD-10-CM | POA: Diagnosis not present

## 2023-04-28 DIAGNOSIS — I10 Essential (primary) hypertension: Secondary | ICD-10-CM | POA: Diagnosis not present

## 2023-04-28 DIAGNOSIS — J9621 Acute and chronic respiratory failure with hypoxia: Secondary | ICD-10-CM | POA: Diagnosis not present

## 2023-05-03 DIAGNOSIS — J449 Chronic obstructive pulmonary disease, unspecified: Secondary | ICD-10-CM | POA: Diagnosis not present

## 2023-05-03 DIAGNOSIS — E43 Unspecified severe protein-calorie malnutrition: Secondary | ICD-10-CM | POA: Diagnosis not present

## 2023-05-03 DIAGNOSIS — J9601 Acute respiratory failure with hypoxia: Secondary | ICD-10-CM | POA: Diagnosis not present

## 2023-05-03 DIAGNOSIS — R5381 Other malaise: Secondary | ICD-10-CM | POA: Diagnosis not present

## 2023-05-03 DIAGNOSIS — C50912 Malignant neoplasm of unspecified site of left female breast: Secondary | ICD-10-CM | POA: Diagnosis not present

## 2023-05-12 DIAGNOSIS — J9621 Acute and chronic respiratory failure with hypoxia: Secondary | ICD-10-CM | POA: Diagnosis not present

## 2023-05-12 DIAGNOSIS — I10 Essential (primary) hypertension: Secondary | ICD-10-CM | POA: Diagnosis not present

## 2023-05-12 DIAGNOSIS — Z430 Encounter for attention to tracheostomy: Secondary | ICD-10-CM | POA: Diagnosis not present

## 2023-05-12 DIAGNOSIS — E0520 Thyrotoxicosis with toxic multinodular goiter without thyrotoxic crisis or storm: Secondary | ICD-10-CM | POA: Diagnosis not present

## 2023-05-12 DIAGNOSIS — C50912 Malignant neoplasm of unspecified site of left female breast: Secondary | ICD-10-CM | POA: Diagnosis not present

## 2023-05-12 DIAGNOSIS — Z931 Gastrostomy status: Secondary | ICD-10-CM | POA: Diagnosis not present

## 2023-05-12 DIAGNOSIS — J441 Chronic obstructive pulmonary disease with (acute) exacerbation: Secondary | ICD-10-CM | POA: Diagnosis not present

## 2023-05-12 DIAGNOSIS — R1312 Dysphagia, oropharyngeal phase: Secondary | ICD-10-CM | POA: Diagnosis not present

## 2023-05-15 DIAGNOSIS — J441 Chronic obstructive pulmonary disease with (acute) exacerbation: Secondary | ICD-10-CM | POA: Diagnosis not present

## 2023-05-15 DIAGNOSIS — J9601 Acute respiratory failure with hypoxia: Secondary | ICD-10-CM | POA: Diagnosis not present

## 2023-05-15 DIAGNOSIS — Z430 Encounter for attention to tracheostomy: Secondary | ICD-10-CM | POA: Diagnosis not present

## 2023-05-15 DIAGNOSIS — E875 Hyperkalemia: Secondary | ICD-10-CM | POA: Diagnosis not present

## 2023-05-15 DIAGNOSIS — E0501 Thyrotoxicosis with diffuse goiter with thyrotoxic crisis or storm: Secondary | ICD-10-CM | POA: Diagnosis not present

## 2023-05-15 DIAGNOSIS — D696 Thrombocytopenia, unspecified: Secondary | ICD-10-CM | POA: Diagnosis not present

## 2023-05-15 DIAGNOSIS — R1310 Dysphagia, unspecified: Secondary | ICD-10-CM | POA: Diagnosis not present

## 2023-05-15 DIAGNOSIS — C50912 Malignant neoplasm of unspecified site of left female breast: Secondary | ICD-10-CM | POA: Diagnosis not present

## 2023-05-15 DIAGNOSIS — C50919 Malignant neoplasm of unspecified site of unspecified female breast: Secondary | ICD-10-CM | POA: Diagnosis not present

## 2023-05-16 DIAGNOSIS — D696 Thrombocytopenia, unspecified: Secondary | ICD-10-CM | POA: Diagnosis not present

## 2023-05-17 DIAGNOSIS — C50919 Malignant neoplasm of unspecified site of unspecified female breast: Secondary | ICD-10-CM | POA: Diagnosis not present

## 2023-05-17 DIAGNOSIS — E875 Hyperkalemia: Secondary | ICD-10-CM | POA: Diagnosis not present

## 2023-05-17 DIAGNOSIS — J9601 Acute respiratory failure with hypoxia: Secondary | ICD-10-CM | POA: Diagnosis not present

## 2023-05-17 DIAGNOSIS — R5381 Other malaise: Secondary | ICD-10-CM | POA: Diagnosis not present

## 2023-05-17 DIAGNOSIS — Z430 Encounter for attention to tracheostomy: Secondary | ICD-10-CM | POA: Diagnosis not present

## 2023-05-17 DIAGNOSIS — C50912 Malignant neoplasm of unspecified site of left female breast: Secondary | ICD-10-CM | POA: Diagnosis not present

## 2023-05-17 DIAGNOSIS — R1310 Dysphagia, unspecified: Secondary | ICD-10-CM | POA: Diagnosis not present

## 2023-05-17 DIAGNOSIS — J449 Chronic obstructive pulmonary disease, unspecified: Secondary | ICD-10-CM | POA: Diagnosis not present

## 2023-05-17 DIAGNOSIS — D696 Thrombocytopenia, unspecified: Secondary | ICD-10-CM | POA: Diagnosis not present

## 2023-05-17 DIAGNOSIS — E43 Unspecified severe protein-calorie malnutrition: Secondary | ICD-10-CM | POA: Diagnosis not present

## 2023-05-17 DIAGNOSIS — J441 Chronic obstructive pulmonary disease with (acute) exacerbation: Secondary | ICD-10-CM | POA: Diagnosis not present

## 2023-05-17 DIAGNOSIS — E0501 Thyrotoxicosis with diffuse goiter with thyrotoxic crisis or storm: Secondary | ICD-10-CM | POA: Diagnosis not present

## 2023-05-18 DIAGNOSIS — J9621 Acute and chronic respiratory failure with hypoxia: Secondary | ICD-10-CM | POA: Diagnosis not present

## 2023-05-18 DIAGNOSIS — Z430 Encounter for attention to tracheostomy: Secondary | ICD-10-CM | POA: Diagnosis not present

## 2023-05-18 DIAGNOSIS — D696 Thrombocytopenia, unspecified: Secondary | ICD-10-CM | POA: Diagnosis not present

## 2023-05-18 DIAGNOSIS — J441 Chronic obstructive pulmonary disease with (acute) exacerbation: Secondary | ICD-10-CM | POA: Diagnosis not present

## 2023-05-18 DIAGNOSIS — E0501 Thyrotoxicosis with diffuse goiter with thyrotoxic crisis or storm: Secondary | ICD-10-CM | POA: Diagnosis not present

## 2023-05-18 DIAGNOSIS — C50912 Malignant neoplasm of unspecified site of left female breast: Secondary | ICD-10-CM | POA: Diagnosis not present

## 2023-05-18 DIAGNOSIS — E875 Hyperkalemia: Secondary | ICD-10-CM | POA: Diagnosis not present

## 2023-05-18 DIAGNOSIS — R1310 Dysphagia, unspecified: Secondary | ICD-10-CM | POA: Diagnosis not present

## 2023-05-18 DIAGNOSIS — J9601 Acute respiratory failure with hypoxia: Secondary | ICD-10-CM | POA: Diagnosis not present

## 2023-05-18 DIAGNOSIS — C50919 Malignant neoplasm of unspecified site of unspecified female breast: Secondary | ICD-10-CM | POA: Diagnosis not present

## 2023-05-19 DIAGNOSIS — D696 Thrombocytopenia, unspecified: Secondary | ICD-10-CM | POA: Diagnosis not present

## 2023-05-19 DIAGNOSIS — C50912 Malignant neoplasm of unspecified site of left female breast: Secondary | ICD-10-CM | POA: Diagnosis not present

## 2023-05-19 DIAGNOSIS — R0989 Other specified symptoms and signs involving the circulatory and respiratory systems: Secondary | ICD-10-CM | POA: Diagnosis not present

## 2023-05-19 DIAGNOSIS — R1310 Dysphagia, unspecified: Secondary | ICD-10-CM | POA: Diagnosis not present

## 2023-05-19 DIAGNOSIS — Z430 Encounter for attention to tracheostomy: Secondary | ICD-10-CM | POA: Diagnosis not present

## 2023-05-19 DIAGNOSIS — J441 Chronic obstructive pulmonary disease with (acute) exacerbation: Secondary | ICD-10-CM | POA: Diagnosis not present

## 2023-05-19 DIAGNOSIS — E875 Hyperkalemia: Secondary | ICD-10-CM | POA: Diagnosis not present

## 2023-05-19 DIAGNOSIS — E0501 Thyrotoxicosis with diffuse goiter with thyrotoxic crisis or storm: Secondary | ICD-10-CM | POA: Diagnosis not present

## 2023-05-19 DIAGNOSIS — J9601 Acute respiratory failure with hypoxia: Secondary | ICD-10-CM | POA: Diagnosis not present

## 2023-05-19 DIAGNOSIS — C50919 Malignant neoplasm of unspecified site of unspecified female breast: Secondary | ICD-10-CM | POA: Diagnosis not present

## 2023-05-20 DIAGNOSIS — Z430 Encounter for attention to tracheostomy: Secondary | ICD-10-CM | POA: Diagnosis not present

## 2023-05-20 DIAGNOSIS — E875 Hyperkalemia: Secondary | ICD-10-CM | POA: Diagnosis not present

## 2023-05-20 DIAGNOSIS — R1310 Dysphagia, unspecified: Secondary | ICD-10-CM | POA: Diagnosis not present

## 2023-05-20 DIAGNOSIS — C50919 Malignant neoplasm of unspecified site of unspecified female breast: Secondary | ICD-10-CM | POA: Diagnosis not present

## 2023-05-20 DIAGNOSIS — D696 Thrombocytopenia, unspecified: Secondary | ICD-10-CM | POA: Diagnosis not present

## 2023-05-20 DIAGNOSIS — J441 Chronic obstructive pulmonary disease with (acute) exacerbation: Secondary | ICD-10-CM | POA: Diagnosis not present

## 2023-05-20 DIAGNOSIS — C50912 Malignant neoplasm of unspecified site of left female breast: Secondary | ICD-10-CM | POA: Diagnosis not present

## 2023-05-20 DIAGNOSIS — E0501 Thyrotoxicosis with diffuse goiter with thyrotoxic crisis or storm: Secondary | ICD-10-CM | POA: Diagnosis not present

## 2023-05-20 DIAGNOSIS — J9601 Acute respiratory failure with hypoxia: Secondary | ICD-10-CM | POA: Diagnosis not present

## 2023-05-21 DIAGNOSIS — J9621 Acute and chronic respiratory failure with hypoxia: Secondary | ICD-10-CM | POA: Diagnosis not present

## 2023-05-23 DIAGNOSIS — C50919 Malignant neoplasm of unspecified site of unspecified female breast: Secondary | ICD-10-CM | POA: Diagnosis not present

## 2023-05-23 DIAGNOSIS — R1310 Dysphagia, unspecified: Secondary | ICD-10-CM | POA: Diagnosis not present

## 2023-05-23 DIAGNOSIS — E875 Hyperkalemia: Secondary | ICD-10-CM | POA: Diagnosis not present

## 2023-05-23 DIAGNOSIS — E0501 Thyrotoxicosis with diffuse goiter with thyrotoxic crisis or storm: Secondary | ICD-10-CM | POA: Diagnosis not present

## 2023-05-23 DIAGNOSIS — J9601 Acute respiratory failure with hypoxia: Secondary | ICD-10-CM | POA: Diagnosis not present

## 2023-05-23 DIAGNOSIS — J441 Chronic obstructive pulmonary disease with (acute) exacerbation: Secondary | ICD-10-CM | POA: Diagnosis not present

## 2023-05-23 DIAGNOSIS — C50912 Malignant neoplasm of unspecified site of left female breast: Secondary | ICD-10-CM | POA: Diagnosis not present

## 2023-05-23 DIAGNOSIS — Z430 Encounter for attention to tracheostomy: Secondary | ICD-10-CM | POA: Diagnosis not present

## 2023-05-23 DIAGNOSIS — D696 Thrombocytopenia, unspecified: Secondary | ICD-10-CM | POA: Diagnosis not present

## 2023-05-24 DIAGNOSIS — D696 Thrombocytopenia, unspecified: Secondary | ICD-10-CM | POA: Diagnosis not present

## 2023-05-24 DIAGNOSIS — Z430 Encounter for attention to tracheostomy: Secondary | ICD-10-CM | POA: Diagnosis not present

## 2023-05-24 DIAGNOSIS — C50919 Malignant neoplasm of unspecified site of unspecified female breast: Secondary | ICD-10-CM | POA: Diagnosis not present

## 2023-05-24 DIAGNOSIS — E0501 Thyrotoxicosis with diffuse goiter with thyrotoxic crisis or storm: Secondary | ICD-10-CM | POA: Diagnosis not present

## 2023-05-24 DIAGNOSIS — J441 Chronic obstructive pulmonary disease with (acute) exacerbation: Secondary | ICD-10-CM | POA: Diagnosis not present

## 2023-05-24 DIAGNOSIS — R1310 Dysphagia, unspecified: Secondary | ICD-10-CM | POA: Diagnosis not present

## 2023-05-24 DIAGNOSIS — R0602 Shortness of breath: Secondary | ICD-10-CM | POA: Diagnosis not present

## 2023-05-24 DIAGNOSIS — C50912 Malignant neoplasm of unspecified site of left female breast: Secondary | ICD-10-CM | POA: Diagnosis not present

## 2023-05-24 DIAGNOSIS — E875 Hyperkalemia: Secondary | ICD-10-CM | POA: Diagnosis not present

## 2023-05-24 DIAGNOSIS — J9601 Acute respiratory failure with hypoxia: Secondary | ICD-10-CM | POA: Diagnosis not present

## 2023-05-24 DIAGNOSIS — R0989 Other specified symptoms and signs involving the circulatory and respiratory systems: Secondary | ICD-10-CM | POA: Diagnosis not present

## 2023-05-25 DIAGNOSIS — J441 Chronic obstructive pulmonary disease with (acute) exacerbation: Secondary | ICD-10-CM | POA: Diagnosis not present

## 2023-05-25 DIAGNOSIS — Z430 Encounter for attention to tracheostomy: Secondary | ICD-10-CM | POA: Diagnosis not present

## 2023-05-25 DIAGNOSIS — E0501 Thyrotoxicosis with diffuse goiter with thyrotoxic crisis or storm: Secondary | ICD-10-CM | POA: Diagnosis not present

## 2023-05-25 DIAGNOSIS — C50912 Malignant neoplasm of unspecified site of left female breast: Secondary | ICD-10-CM | POA: Diagnosis not present

## 2023-05-25 DIAGNOSIS — Z4659 Encounter for fitting and adjustment of other gastrointestinal appliance and device: Secondary | ICD-10-CM | POA: Diagnosis not present

## 2023-05-25 DIAGNOSIS — C50919 Malignant neoplasm of unspecified site of unspecified female breast: Secondary | ICD-10-CM | POA: Diagnosis not present

## 2023-05-25 DIAGNOSIS — D696 Thrombocytopenia, unspecified: Secondary | ICD-10-CM | POA: Diagnosis not present

## 2023-05-25 DIAGNOSIS — J9601 Acute respiratory failure with hypoxia: Secondary | ICD-10-CM | POA: Diagnosis not present

## 2023-05-25 DIAGNOSIS — E875 Hyperkalemia: Secondary | ICD-10-CM | POA: Diagnosis not present

## 2023-05-25 DIAGNOSIS — R1310 Dysphagia, unspecified: Secondary | ICD-10-CM | POA: Diagnosis not present

## 2023-05-25 DIAGNOSIS — R109 Unspecified abdominal pain: Secondary | ICD-10-CM | POA: Diagnosis not present

## 2023-05-26 DIAGNOSIS — E875 Hyperkalemia: Secondary | ICD-10-CM | POA: Diagnosis not present

## 2023-05-26 DIAGNOSIS — J9601 Acute respiratory failure with hypoxia: Secondary | ICD-10-CM | POA: Diagnosis not present

## 2023-05-26 DIAGNOSIS — Z430 Encounter for attention to tracheostomy: Secondary | ICD-10-CM | POA: Diagnosis not present

## 2023-05-26 DIAGNOSIS — C50912 Malignant neoplasm of unspecified site of left female breast: Secondary | ICD-10-CM | POA: Diagnosis not present

## 2023-05-26 DIAGNOSIS — E0501 Thyrotoxicosis with diffuse goiter with thyrotoxic crisis or storm: Secondary | ICD-10-CM | POA: Diagnosis not present

## 2023-05-26 DIAGNOSIS — J441 Chronic obstructive pulmonary disease with (acute) exacerbation: Secondary | ICD-10-CM | POA: Diagnosis not present

## 2023-05-26 DIAGNOSIS — D696 Thrombocytopenia, unspecified: Secondary | ICD-10-CM | POA: Diagnosis not present

## 2023-05-26 DIAGNOSIS — R1310 Dysphagia, unspecified: Secondary | ICD-10-CM | POA: Diagnosis not present

## 2023-05-26 DIAGNOSIS — C50919 Malignant neoplasm of unspecified site of unspecified female breast: Secondary | ICD-10-CM | POA: Diagnosis not present

## 2023-05-27 DIAGNOSIS — D696 Thrombocytopenia, unspecified: Secondary | ICD-10-CM | POA: Diagnosis not present

## 2023-05-27 DIAGNOSIS — C50912 Malignant neoplasm of unspecified site of left female breast: Secondary | ICD-10-CM | POA: Diagnosis not present

## 2023-05-27 DIAGNOSIS — E0501 Thyrotoxicosis with diffuse goiter with thyrotoxic crisis or storm: Secondary | ICD-10-CM | POA: Diagnosis not present

## 2023-05-27 DIAGNOSIS — J441 Chronic obstructive pulmonary disease with (acute) exacerbation: Secondary | ICD-10-CM | POA: Diagnosis not present

## 2023-05-27 DIAGNOSIS — R1310 Dysphagia, unspecified: Secondary | ICD-10-CM | POA: Diagnosis not present

## 2023-05-27 DIAGNOSIS — Z430 Encounter for attention to tracheostomy: Secondary | ICD-10-CM | POA: Diagnosis not present

## 2023-05-27 DIAGNOSIS — E875 Hyperkalemia: Secondary | ICD-10-CM | POA: Diagnosis not present

## 2023-05-27 DIAGNOSIS — J9601 Acute respiratory failure with hypoxia: Secondary | ICD-10-CM | POA: Diagnosis not present

## 2023-05-27 DIAGNOSIS — C50919 Malignant neoplasm of unspecified site of unspecified female breast: Secondary | ICD-10-CM | POA: Diagnosis not present

## 2023-05-30 DIAGNOSIS — E0501 Thyrotoxicosis with diffuse goiter with thyrotoxic crisis or storm: Secondary | ICD-10-CM | POA: Diagnosis not present

## 2023-05-30 DIAGNOSIS — R1310 Dysphagia, unspecified: Secondary | ICD-10-CM | POA: Diagnosis not present

## 2023-05-30 DIAGNOSIS — J441 Chronic obstructive pulmonary disease with (acute) exacerbation: Secondary | ICD-10-CM | POA: Diagnosis not present

## 2023-05-30 DIAGNOSIS — C50912 Malignant neoplasm of unspecified site of left female breast: Secondary | ICD-10-CM | POA: Diagnosis not present

## 2023-05-30 DIAGNOSIS — J9621 Acute and chronic respiratory failure with hypoxia: Secondary | ICD-10-CM | POA: Diagnosis not present

## 2023-05-30 DIAGNOSIS — D696 Thrombocytopenia, unspecified: Secondary | ICD-10-CM | POA: Diagnosis not present

## 2023-05-30 DIAGNOSIS — Z430 Encounter for attention to tracheostomy: Secondary | ICD-10-CM | POA: Diagnosis not present

## 2023-05-30 DIAGNOSIS — J9601 Acute respiratory failure with hypoxia: Secondary | ICD-10-CM | POA: Diagnosis not present

## 2023-05-30 DIAGNOSIS — C50919 Malignant neoplasm of unspecified site of unspecified female breast: Secondary | ICD-10-CM | POA: Diagnosis not present

## 2023-05-30 DIAGNOSIS — E875 Hyperkalemia: Secondary | ICD-10-CM | POA: Diagnosis not present

## 2023-05-31 DIAGNOSIS — J9601 Acute respiratory failure with hypoxia: Secondary | ICD-10-CM | POA: Diagnosis not present

## 2023-05-31 DIAGNOSIS — C50919 Malignant neoplasm of unspecified site of unspecified female breast: Secondary | ICD-10-CM | POA: Diagnosis not present

## 2023-05-31 DIAGNOSIS — J441 Chronic obstructive pulmonary disease with (acute) exacerbation: Secondary | ICD-10-CM | POA: Diagnosis not present

## 2023-05-31 DIAGNOSIS — C50912 Malignant neoplasm of unspecified site of left female breast: Secondary | ICD-10-CM | POA: Diagnosis not present

## 2023-05-31 DIAGNOSIS — E43 Unspecified severe protein-calorie malnutrition: Secondary | ICD-10-CM | POA: Diagnosis not present

## 2023-05-31 DIAGNOSIS — R5381 Other malaise: Secondary | ICD-10-CM | POA: Diagnosis not present

## 2023-05-31 DIAGNOSIS — Z430 Encounter for attention to tracheostomy: Secondary | ICD-10-CM | POA: Diagnosis not present

## 2023-05-31 DIAGNOSIS — E875 Hyperkalemia: Secondary | ICD-10-CM | POA: Diagnosis not present

## 2023-05-31 DIAGNOSIS — R1310 Dysphagia, unspecified: Secondary | ICD-10-CM | POA: Diagnosis not present

## 2023-05-31 DIAGNOSIS — D696 Thrombocytopenia, unspecified: Secondary | ICD-10-CM | POA: Diagnosis not present

## 2023-05-31 DIAGNOSIS — J449 Chronic obstructive pulmonary disease, unspecified: Secondary | ICD-10-CM | POA: Diagnosis not present

## 2023-05-31 DIAGNOSIS — E0501 Thyrotoxicosis with diffuse goiter with thyrotoxic crisis or storm: Secondary | ICD-10-CM | POA: Diagnosis not present

## 2023-06-01 DIAGNOSIS — R1310 Dysphagia, unspecified: Secondary | ICD-10-CM | POA: Diagnosis not present

## 2023-06-01 DIAGNOSIS — E0501 Thyrotoxicosis with diffuse goiter with thyrotoxic crisis or storm: Secondary | ICD-10-CM | POA: Diagnosis not present

## 2023-06-01 DIAGNOSIS — D696 Thrombocytopenia, unspecified: Secondary | ICD-10-CM | POA: Diagnosis not present

## 2023-06-01 DIAGNOSIS — C50919 Malignant neoplasm of unspecified site of unspecified female breast: Secondary | ICD-10-CM | POA: Diagnosis not present

## 2023-06-01 DIAGNOSIS — Z430 Encounter for attention to tracheostomy: Secondary | ICD-10-CM | POA: Diagnosis not present

## 2023-06-01 DIAGNOSIS — J9601 Acute respiratory failure with hypoxia: Secondary | ICD-10-CM | POA: Diagnosis not present

## 2023-06-01 DIAGNOSIS — E875 Hyperkalemia: Secondary | ICD-10-CM | POA: Diagnosis not present

## 2023-06-01 DIAGNOSIS — J441 Chronic obstructive pulmonary disease with (acute) exacerbation: Secondary | ICD-10-CM | POA: Diagnosis not present

## 2023-06-01 DIAGNOSIS — C50912 Malignant neoplasm of unspecified site of left female breast: Secondary | ICD-10-CM | POA: Diagnosis not present

## 2023-06-02 DIAGNOSIS — Z430 Encounter for attention to tracheostomy: Secondary | ICD-10-CM | POA: Diagnosis not present

## 2023-06-02 DIAGNOSIS — D696 Thrombocytopenia, unspecified: Secondary | ICD-10-CM | POA: Diagnosis not present

## 2023-06-02 DIAGNOSIS — R1310 Dysphagia, unspecified: Secondary | ICD-10-CM | POA: Diagnosis not present

## 2023-06-02 DIAGNOSIS — C50919 Malignant neoplasm of unspecified site of unspecified female breast: Secondary | ICD-10-CM | POA: Diagnosis not present

## 2023-06-02 DIAGNOSIS — J441 Chronic obstructive pulmonary disease with (acute) exacerbation: Secondary | ICD-10-CM | POA: Diagnosis not present

## 2023-06-02 DIAGNOSIS — J9601 Acute respiratory failure with hypoxia: Secondary | ICD-10-CM | POA: Diagnosis not present

## 2023-06-02 DIAGNOSIS — E0501 Thyrotoxicosis with diffuse goiter with thyrotoxic crisis or storm: Secondary | ICD-10-CM | POA: Diagnosis not present

## 2023-06-02 DIAGNOSIS — C50912 Malignant neoplasm of unspecified site of left female breast: Secondary | ICD-10-CM | POA: Diagnosis not present

## 2023-06-02 DIAGNOSIS — E875 Hyperkalemia: Secondary | ICD-10-CM | POA: Diagnosis not present

## 2023-06-03 DIAGNOSIS — R1310 Dysphagia, unspecified: Secondary | ICD-10-CM | POA: Diagnosis not present

## 2023-06-03 DIAGNOSIS — D696 Thrombocytopenia, unspecified: Secondary | ICD-10-CM | POA: Diagnosis not present

## 2023-06-03 DIAGNOSIS — Z430 Encounter for attention to tracheostomy: Secondary | ICD-10-CM | POA: Diagnosis not present

## 2023-06-03 DIAGNOSIS — J441 Chronic obstructive pulmonary disease with (acute) exacerbation: Secondary | ICD-10-CM | POA: Diagnosis not present

## 2023-06-03 DIAGNOSIS — C50912 Malignant neoplasm of unspecified site of left female breast: Secondary | ICD-10-CM | POA: Diagnosis not present

## 2023-06-03 DIAGNOSIS — E875 Hyperkalemia: Secondary | ICD-10-CM | POA: Diagnosis not present

## 2023-06-03 DIAGNOSIS — C50919 Malignant neoplasm of unspecified site of unspecified female breast: Secondary | ICD-10-CM | POA: Diagnosis not present

## 2023-06-03 DIAGNOSIS — J9601 Acute respiratory failure with hypoxia: Secondary | ICD-10-CM | POA: Diagnosis not present

## 2023-06-03 DIAGNOSIS — E0501 Thyrotoxicosis with diffuse goiter with thyrotoxic crisis or storm: Secondary | ICD-10-CM | POA: Diagnosis not present

## 2023-06-04 DIAGNOSIS — Z430 Encounter for attention to tracheostomy: Secondary | ICD-10-CM | POA: Diagnosis not present

## 2023-06-04 DIAGNOSIS — J9621 Acute and chronic respiratory failure with hypoxia: Secondary | ICD-10-CM | POA: Diagnosis not present

## 2023-06-04 DIAGNOSIS — D696 Thrombocytopenia, unspecified: Secondary | ICD-10-CM | POA: Diagnosis not present

## 2023-06-04 DIAGNOSIS — J9601 Acute respiratory failure with hypoxia: Secondary | ICD-10-CM | POA: Diagnosis not present

## 2023-06-04 DIAGNOSIS — J449 Chronic obstructive pulmonary disease, unspecified: Secondary | ICD-10-CM | POA: Diagnosis not present

## 2023-06-04 DIAGNOSIS — R0989 Other specified symptoms and signs involving the circulatory and respiratory systems: Secondary | ICD-10-CM | POA: Diagnosis not present

## 2023-06-04 DIAGNOSIS — R5381 Other malaise: Secondary | ICD-10-CM | POA: Diagnosis not present

## 2023-06-04 DIAGNOSIS — R1310 Dysphagia, unspecified: Secondary | ICD-10-CM | POA: Diagnosis not present

## 2023-06-04 DIAGNOSIS — J441 Chronic obstructive pulmonary disease with (acute) exacerbation: Secondary | ICD-10-CM | POA: Diagnosis not present

## 2023-06-04 DIAGNOSIS — E43 Unspecified severe protein-calorie malnutrition: Secondary | ICD-10-CM | POA: Diagnosis not present

## 2023-06-04 DIAGNOSIS — E875 Hyperkalemia: Secondary | ICD-10-CM | POA: Diagnosis not present

## 2023-06-04 DIAGNOSIS — C50912 Malignant neoplasm of unspecified site of left female breast: Secondary | ICD-10-CM | POA: Diagnosis not present

## 2023-06-04 DIAGNOSIS — E0501 Thyrotoxicosis with diffuse goiter with thyrotoxic crisis or storm: Secondary | ICD-10-CM | POA: Diagnosis not present

## 2023-06-04 DIAGNOSIS — C50919 Malignant neoplasm of unspecified site of unspecified female breast: Secondary | ICD-10-CM | POA: Diagnosis not present

## 2023-06-06 DIAGNOSIS — Z430 Encounter for attention to tracheostomy: Secondary | ICD-10-CM | POA: Diagnosis not present

## 2023-06-06 DIAGNOSIS — E0501 Thyrotoxicosis with diffuse goiter with thyrotoxic crisis or storm: Secondary | ICD-10-CM | POA: Diagnosis not present

## 2023-06-06 DIAGNOSIS — D696 Thrombocytopenia, unspecified: Secondary | ICD-10-CM | POA: Diagnosis not present

## 2023-06-06 DIAGNOSIS — J441 Chronic obstructive pulmonary disease with (acute) exacerbation: Secondary | ICD-10-CM | POA: Diagnosis not present

## 2023-06-06 DIAGNOSIS — R1310 Dysphagia, unspecified: Secondary | ICD-10-CM | POA: Diagnosis not present

## 2023-06-06 DIAGNOSIS — J9601 Acute respiratory failure with hypoxia: Secondary | ICD-10-CM | POA: Diagnosis not present

## 2023-06-06 DIAGNOSIS — E875 Hyperkalemia: Secondary | ICD-10-CM | POA: Diagnosis not present

## 2023-06-06 DIAGNOSIS — C50912 Malignant neoplasm of unspecified site of left female breast: Secondary | ICD-10-CM | POA: Diagnosis not present

## 2023-06-06 DIAGNOSIS — C50919 Malignant neoplasm of unspecified site of unspecified female breast: Secondary | ICD-10-CM | POA: Diagnosis not present

## 2023-06-07 DIAGNOSIS — C50919 Malignant neoplasm of unspecified site of unspecified female breast: Secondary | ICD-10-CM | POA: Diagnosis not present

## 2023-06-07 DIAGNOSIS — R1310 Dysphagia, unspecified: Secondary | ICD-10-CM | POA: Diagnosis not present

## 2023-06-07 DIAGNOSIS — J9601 Acute respiratory failure with hypoxia: Secondary | ICD-10-CM | POA: Diagnosis not present

## 2023-06-07 DIAGNOSIS — D696 Thrombocytopenia, unspecified: Secondary | ICD-10-CM | POA: Diagnosis not present

## 2023-06-07 DIAGNOSIS — E875 Hyperkalemia: Secondary | ICD-10-CM | POA: Diagnosis not present

## 2023-06-07 DIAGNOSIS — J441 Chronic obstructive pulmonary disease with (acute) exacerbation: Secondary | ICD-10-CM | POA: Diagnosis not present

## 2023-06-07 DIAGNOSIS — E0501 Thyrotoxicosis with diffuse goiter with thyrotoxic crisis or storm: Secondary | ICD-10-CM | POA: Diagnosis not present

## 2023-06-07 DIAGNOSIS — Z430 Encounter for attention to tracheostomy: Secondary | ICD-10-CM | POA: Diagnosis not present

## 2023-06-07 DIAGNOSIS — C50912 Malignant neoplasm of unspecified site of left female breast: Secondary | ICD-10-CM | POA: Diagnosis not present

## 2023-06-08 DIAGNOSIS — J9601 Acute respiratory failure with hypoxia: Secondary | ICD-10-CM | POA: Diagnosis not present

## 2023-06-08 DIAGNOSIS — J441 Chronic obstructive pulmonary disease with (acute) exacerbation: Secondary | ICD-10-CM | POA: Diagnosis not present

## 2023-06-08 DIAGNOSIS — E875 Hyperkalemia: Secondary | ICD-10-CM | POA: Diagnosis not present

## 2023-06-08 DIAGNOSIS — D696 Thrombocytopenia, unspecified: Secondary | ICD-10-CM | POA: Diagnosis not present

## 2023-06-08 DIAGNOSIS — C50919 Malignant neoplasm of unspecified site of unspecified female breast: Secondary | ICD-10-CM | POA: Diagnosis not present

## 2023-06-08 DIAGNOSIS — Z430 Encounter for attention to tracheostomy: Secondary | ICD-10-CM | POA: Diagnosis not present

## 2023-06-08 DIAGNOSIS — C50912 Malignant neoplasm of unspecified site of left female breast: Secondary | ICD-10-CM | POA: Diagnosis not present

## 2023-06-08 DIAGNOSIS — R1310 Dysphagia, unspecified: Secondary | ICD-10-CM | POA: Diagnosis not present

## 2023-06-08 DIAGNOSIS — E0501 Thyrotoxicosis with diffuse goiter with thyrotoxic crisis or storm: Secondary | ICD-10-CM | POA: Diagnosis not present

## 2023-06-09 DIAGNOSIS — C50919 Malignant neoplasm of unspecified site of unspecified female breast: Secondary | ICD-10-CM | POA: Diagnosis not present

## 2023-06-09 DIAGNOSIS — D696 Thrombocytopenia, unspecified: Secondary | ICD-10-CM | POA: Diagnosis not present

## 2023-06-09 DIAGNOSIS — C50912 Malignant neoplasm of unspecified site of left female breast: Secondary | ICD-10-CM | POA: Diagnosis not present

## 2023-06-09 DIAGNOSIS — J441 Chronic obstructive pulmonary disease with (acute) exacerbation: Secondary | ICD-10-CM | POA: Diagnosis not present

## 2023-06-09 DIAGNOSIS — E875 Hyperkalemia: Secondary | ICD-10-CM | POA: Diagnosis not present

## 2023-06-09 DIAGNOSIS — E0501 Thyrotoxicosis with diffuse goiter with thyrotoxic crisis or storm: Secondary | ICD-10-CM | POA: Diagnosis not present

## 2023-06-09 DIAGNOSIS — J9601 Acute respiratory failure with hypoxia: Secondary | ICD-10-CM | POA: Diagnosis not present

## 2023-06-09 DIAGNOSIS — Z430 Encounter for attention to tracheostomy: Secondary | ICD-10-CM | POA: Diagnosis not present

## 2023-06-09 DIAGNOSIS — R1310 Dysphagia, unspecified: Secondary | ICD-10-CM | POA: Diagnosis not present

## 2023-06-10 DIAGNOSIS — J441 Chronic obstructive pulmonary disease with (acute) exacerbation: Secondary | ICD-10-CM | POA: Diagnosis not present

## 2023-06-10 DIAGNOSIS — R0989 Other specified symptoms and signs involving the circulatory and respiratory systems: Secondary | ICD-10-CM | POA: Diagnosis not present

## 2023-06-10 DIAGNOSIS — E0501 Thyrotoxicosis with diffuse goiter with thyrotoxic crisis or storm: Secondary | ICD-10-CM | POA: Diagnosis not present

## 2023-06-10 DIAGNOSIS — Z430 Encounter for attention to tracheostomy: Secondary | ICD-10-CM | POA: Diagnosis not present

## 2023-06-10 DIAGNOSIS — C50919 Malignant neoplasm of unspecified site of unspecified female breast: Secondary | ICD-10-CM | POA: Diagnosis not present

## 2023-06-10 DIAGNOSIS — D696 Thrombocytopenia, unspecified: Secondary | ICD-10-CM | POA: Diagnosis not present

## 2023-06-10 DIAGNOSIS — E875 Hyperkalemia: Secondary | ICD-10-CM | POA: Diagnosis not present

## 2023-06-10 DIAGNOSIS — C50912 Malignant neoplasm of unspecified site of left female breast: Secondary | ICD-10-CM | POA: Diagnosis not present

## 2023-06-10 DIAGNOSIS — J9601 Acute respiratory failure with hypoxia: Secondary | ICD-10-CM | POA: Diagnosis not present

## 2023-06-10 DIAGNOSIS — R1310 Dysphagia, unspecified: Secondary | ICD-10-CM | POA: Diagnosis not present

## 2023-06-12 DIAGNOSIS — J9621 Acute and chronic respiratory failure with hypoxia: Secondary | ICD-10-CM | POA: Diagnosis not present

## 2023-06-13 DIAGNOSIS — C50919 Malignant neoplasm of unspecified site of unspecified female breast: Secondary | ICD-10-CM | POA: Diagnosis not present

## 2023-06-13 DIAGNOSIS — Z430 Encounter for attention to tracheostomy: Secondary | ICD-10-CM | POA: Diagnosis not present

## 2023-06-13 DIAGNOSIS — J9601 Acute respiratory failure with hypoxia: Secondary | ICD-10-CM | POA: Diagnosis not present

## 2023-06-13 DIAGNOSIS — J441 Chronic obstructive pulmonary disease with (acute) exacerbation: Secondary | ICD-10-CM | POA: Diagnosis not present

## 2023-06-13 DIAGNOSIS — D696 Thrombocytopenia, unspecified: Secondary | ICD-10-CM | POA: Diagnosis not present

## 2023-06-13 DIAGNOSIS — E0501 Thyrotoxicosis with diffuse goiter with thyrotoxic crisis or storm: Secondary | ICD-10-CM | POA: Diagnosis not present

## 2023-06-13 DIAGNOSIS — R1310 Dysphagia, unspecified: Secondary | ICD-10-CM | POA: Diagnosis not present

## 2023-06-13 DIAGNOSIS — C50912 Malignant neoplasm of unspecified site of left female breast: Secondary | ICD-10-CM | POA: Diagnosis not present

## 2023-06-13 DIAGNOSIS — E875 Hyperkalemia: Secondary | ICD-10-CM | POA: Diagnosis not present

## 2023-06-14 DIAGNOSIS — J449 Chronic obstructive pulmonary disease, unspecified: Secondary | ICD-10-CM | POA: Diagnosis not present

## 2023-06-14 DIAGNOSIS — J9601 Acute respiratory failure with hypoxia: Secondary | ICD-10-CM | POA: Diagnosis not present

## 2023-06-14 DIAGNOSIS — E43 Unspecified severe protein-calorie malnutrition: Secondary | ICD-10-CM | POA: Diagnosis not present

## 2023-06-14 DIAGNOSIS — D696 Thrombocytopenia, unspecified: Secondary | ICD-10-CM | POA: Diagnosis not present

## 2023-06-14 DIAGNOSIS — C50912 Malignant neoplasm of unspecified site of left female breast: Secondary | ICD-10-CM | POA: Diagnosis not present

## 2023-06-14 DIAGNOSIS — J441 Chronic obstructive pulmonary disease with (acute) exacerbation: Secondary | ICD-10-CM | POA: Diagnosis not present

## 2023-06-14 DIAGNOSIS — C50919 Malignant neoplasm of unspecified site of unspecified female breast: Secondary | ICD-10-CM | POA: Diagnosis not present

## 2023-06-14 DIAGNOSIS — E0501 Thyrotoxicosis with diffuse goiter with thyrotoxic crisis or storm: Secondary | ICD-10-CM | POA: Diagnosis not present

## 2023-06-14 DIAGNOSIS — Z430 Encounter for attention to tracheostomy: Secondary | ICD-10-CM | POA: Diagnosis not present

## 2023-06-14 DIAGNOSIS — R1310 Dysphagia, unspecified: Secondary | ICD-10-CM | POA: Diagnosis not present

## 2023-06-14 DIAGNOSIS — R5381 Other malaise: Secondary | ICD-10-CM | POA: Diagnosis not present

## 2023-06-14 DIAGNOSIS — E875 Hyperkalemia: Secondary | ICD-10-CM | POA: Diagnosis not present

## 2023-06-15 DIAGNOSIS — C50919 Malignant neoplasm of unspecified site of unspecified female breast: Secondary | ICD-10-CM | POA: Diagnosis not present

## 2023-06-15 DIAGNOSIS — J9601 Acute respiratory failure with hypoxia: Secondary | ICD-10-CM | POA: Diagnosis not present

## 2023-06-15 DIAGNOSIS — Z430 Encounter for attention to tracheostomy: Secondary | ICD-10-CM | POA: Diagnosis not present

## 2023-06-15 DIAGNOSIS — C50912 Malignant neoplasm of unspecified site of left female breast: Secondary | ICD-10-CM | POA: Diagnosis not present

## 2023-06-15 DIAGNOSIS — E0501 Thyrotoxicosis with diffuse goiter with thyrotoxic crisis or storm: Secondary | ICD-10-CM | POA: Diagnosis not present

## 2023-06-15 DIAGNOSIS — D696 Thrombocytopenia, unspecified: Secondary | ICD-10-CM | POA: Diagnosis not present

## 2023-06-15 DIAGNOSIS — J441 Chronic obstructive pulmonary disease with (acute) exacerbation: Secondary | ICD-10-CM | POA: Diagnosis not present

## 2023-06-15 DIAGNOSIS — E875 Hyperkalemia: Secondary | ICD-10-CM | POA: Diagnosis not present

## 2023-06-15 DIAGNOSIS — R1310 Dysphagia, unspecified: Secondary | ICD-10-CM | POA: Diagnosis not present

## 2023-06-16 DIAGNOSIS — J441 Chronic obstructive pulmonary disease with (acute) exacerbation: Secondary | ICD-10-CM | POA: Diagnosis not present

## 2023-06-16 DIAGNOSIS — E0501 Thyrotoxicosis with diffuse goiter with thyrotoxic crisis or storm: Secondary | ICD-10-CM | POA: Diagnosis not present

## 2023-06-16 DIAGNOSIS — Z430 Encounter for attention to tracheostomy: Secondary | ICD-10-CM | POA: Diagnosis not present

## 2023-06-16 DIAGNOSIS — R1310 Dysphagia, unspecified: Secondary | ICD-10-CM | POA: Diagnosis not present

## 2023-06-16 DIAGNOSIS — D696 Thrombocytopenia, unspecified: Secondary | ICD-10-CM | POA: Diagnosis not present

## 2023-06-16 DIAGNOSIS — E875 Hyperkalemia: Secondary | ICD-10-CM | POA: Diagnosis not present

## 2023-06-16 DIAGNOSIS — C50919 Malignant neoplasm of unspecified site of unspecified female breast: Secondary | ICD-10-CM | POA: Diagnosis not present

## 2023-06-16 DIAGNOSIS — J9601 Acute respiratory failure with hypoxia: Secondary | ICD-10-CM | POA: Diagnosis not present

## 2023-06-16 DIAGNOSIS — C50912 Malignant neoplasm of unspecified site of left female breast: Secondary | ICD-10-CM | POA: Diagnosis not present

## 2023-06-17 DIAGNOSIS — Z430 Encounter for attention to tracheostomy: Secondary | ICD-10-CM | POA: Diagnosis not present

## 2023-06-17 DIAGNOSIS — C50912 Malignant neoplasm of unspecified site of left female breast: Secondary | ICD-10-CM | POA: Diagnosis not present

## 2023-06-17 DIAGNOSIS — J9601 Acute respiratory failure with hypoxia: Secondary | ICD-10-CM | POA: Diagnosis not present

## 2023-06-17 DIAGNOSIS — R1310 Dysphagia, unspecified: Secondary | ICD-10-CM | POA: Diagnosis not present

## 2023-06-17 DIAGNOSIS — D696 Thrombocytopenia, unspecified: Secondary | ICD-10-CM | POA: Diagnosis not present

## 2023-06-17 DIAGNOSIS — E875 Hyperkalemia: Secondary | ICD-10-CM | POA: Diagnosis not present

## 2023-06-17 DIAGNOSIS — C50919 Malignant neoplasm of unspecified site of unspecified female breast: Secondary | ICD-10-CM | POA: Diagnosis not present

## 2023-06-17 DIAGNOSIS — E0501 Thyrotoxicosis with diffuse goiter with thyrotoxic crisis or storm: Secondary | ICD-10-CM | POA: Diagnosis not present

## 2023-06-17 DIAGNOSIS — J441 Chronic obstructive pulmonary disease with (acute) exacerbation: Secondary | ICD-10-CM | POA: Diagnosis not present

## 2023-06-20 DIAGNOSIS — E875 Hyperkalemia: Secondary | ICD-10-CM | POA: Diagnosis not present

## 2023-06-20 DIAGNOSIS — E0501 Thyrotoxicosis with diffuse goiter with thyrotoxic crisis or storm: Secondary | ICD-10-CM | POA: Diagnosis not present

## 2023-06-20 DIAGNOSIS — J441 Chronic obstructive pulmonary disease with (acute) exacerbation: Secondary | ICD-10-CM | POA: Diagnosis not present

## 2023-06-20 DIAGNOSIS — D696 Thrombocytopenia, unspecified: Secondary | ICD-10-CM | POA: Diagnosis not present

## 2023-06-20 DIAGNOSIS — J9601 Acute respiratory failure with hypoxia: Secondary | ICD-10-CM | POA: Diagnosis not present

## 2023-06-20 DIAGNOSIS — R1310 Dysphagia, unspecified: Secondary | ICD-10-CM | POA: Diagnosis not present

## 2023-06-20 DIAGNOSIS — Z430 Encounter for attention to tracheostomy: Secondary | ICD-10-CM | POA: Diagnosis not present

## 2023-06-20 DIAGNOSIS — C50912 Malignant neoplasm of unspecified site of left female breast: Secondary | ICD-10-CM | POA: Diagnosis not present

## 2023-06-20 DIAGNOSIS — C50919 Malignant neoplasm of unspecified site of unspecified female breast: Secondary | ICD-10-CM | POA: Diagnosis not present

## 2023-06-21 ENCOUNTER — Other Ambulatory Visit: Payer: Self-pay | Admitting: Internal Medicine

## 2023-06-21 DIAGNOSIS — E0501 Thyrotoxicosis with diffuse goiter with thyrotoxic crisis or storm: Secondary | ICD-10-CM | POA: Diagnosis not present

## 2023-06-21 DIAGNOSIS — J441 Chronic obstructive pulmonary disease with (acute) exacerbation: Secondary | ICD-10-CM | POA: Diagnosis not present

## 2023-06-21 DIAGNOSIS — C50912 Malignant neoplasm of unspecified site of left female breast: Secondary | ICD-10-CM | POA: Diagnosis not present

## 2023-06-21 DIAGNOSIS — Z430 Encounter for attention to tracheostomy: Secondary | ICD-10-CM | POA: Diagnosis not present

## 2023-06-21 DIAGNOSIS — D696 Thrombocytopenia, unspecified: Secondary | ICD-10-CM | POA: Diagnosis not present

## 2023-06-21 DIAGNOSIS — C50919 Malignant neoplasm of unspecified site of unspecified female breast: Secondary | ICD-10-CM | POA: Diagnosis not present

## 2023-06-21 DIAGNOSIS — R1310 Dysphagia, unspecified: Secondary | ICD-10-CM | POA: Diagnosis not present

## 2023-06-21 DIAGNOSIS — J9601 Acute respiratory failure with hypoxia: Secondary | ICD-10-CM | POA: Diagnosis not present

## 2023-06-21 DIAGNOSIS — E875 Hyperkalemia: Secondary | ICD-10-CM | POA: Diagnosis not present

## 2023-06-22 DIAGNOSIS — D696 Thrombocytopenia, unspecified: Secondary | ICD-10-CM | POA: Diagnosis not present

## 2023-06-22 DIAGNOSIS — J9601 Acute respiratory failure with hypoxia: Secondary | ICD-10-CM | POA: Diagnosis not present

## 2023-06-22 DIAGNOSIS — C50912 Malignant neoplasm of unspecified site of left female breast: Secondary | ICD-10-CM | POA: Diagnosis not present

## 2023-06-22 DIAGNOSIS — E875 Hyperkalemia: Secondary | ICD-10-CM | POA: Diagnosis not present

## 2023-06-22 DIAGNOSIS — E0501 Thyrotoxicosis with diffuse goiter with thyrotoxic crisis or storm: Secondary | ICD-10-CM | POA: Diagnosis not present

## 2023-06-22 DIAGNOSIS — Z430 Encounter for attention to tracheostomy: Secondary | ICD-10-CM | POA: Diagnosis not present

## 2023-06-22 DIAGNOSIS — C50919 Malignant neoplasm of unspecified site of unspecified female breast: Secondary | ICD-10-CM | POA: Diagnosis not present

## 2023-06-22 DIAGNOSIS — J441 Chronic obstructive pulmonary disease with (acute) exacerbation: Secondary | ICD-10-CM | POA: Diagnosis not present

## 2023-06-22 DIAGNOSIS — R1310 Dysphagia, unspecified: Secondary | ICD-10-CM | POA: Diagnosis not present

## 2023-06-23 DIAGNOSIS — C50919 Malignant neoplasm of unspecified site of unspecified female breast: Secondary | ICD-10-CM | POA: Diagnosis not present

## 2023-06-23 DIAGNOSIS — E875 Hyperkalemia: Secondary | ICD-10-CM | POA: Diagnosis not present

## 2023-06-23 DIAGNOSIS — R1310 Dysphagia, unspecified: Secondary | ICD-10-CM | POA: Diagnosis not present

## 2023-06-23 DIAGNOSIS — C50912 Malignant neoplasm of unspecified site of left female breast: Secondary | ICD-10-CM | POA: Diagnosis not present

## 2023-06-23 DIAGNOSIS — E0501 Thyrotoxicosis with diffuse goiter with thyrotoxic crisis or storm: Secondary | ICD-10-CM | POA: Diagnosis not present

## 2023-06-23 DIAGNOSIS — Z430 Encounter for attention to tracheostomy: Secondary | ICD-10-CM | POA: Diagnosis not present

## 2023-06-23 DIAGNOSIS — J9601 Acute respiratory failure with hypoxia: Secondary | ICD-10-CM | POA: Diagnosis not present

## 2023-06-23 DIAGNOSIS — D696 Thrombocytopenia, unspecified: Secondary | ICD-10-CM | POA: Diagnosis not present

## 2023-06-23 DIAGNOSIS — J441 Chronic obstructive pulmonary disease with (acute) exacerbation: Secondary | ICD-10-CM | POA: Diagnosis not present

## 2023-06-24 DIAGNOSIS — E0501 Thyrotoxicosis with diffuse goiter with thyrotoxic crisis or storm: Secondary | ICD-10-CM | POA: Diagnosis not present

## 2023-06-24 DIAGNOSIS — Z430 Encounter for attention to tracheostomy: Secondary | ICD-10-CM | POA: Diagnosis not present

## 2023-06-24 DIAGNOSIS — E875 Hyperkalemia: Secondary | ICD-10-CM | POA: Diagnosis not present

## 2023-06-24 DIAGNOSIS — D696 Thrombocytopenia, unspecified: Secondary | ICD-10-CM | POA: Diagnosis not present

## 2023-06-24 DIAGNOSIS — R1310 Dysphagia, unspecified: Secondary | ICD-10-CM | POA: Diagnosis not present

## 2023-06-24 DIAGNOSIS — C50912 Malignant neoplasm of unspecified site of left female breast: Secondary | ICD-10-CM | POA: Diagnosis not present

## 2023-06-24 DIAGNOSIS — J441 Chronic obstructive pulmonary disease with (acute) exacerbation: Secondary | ICD-10-CM | POA: Diagnosis not present

## 2023-06-24 DIAGNOSIS — J9601 Acute respiratory failure with hypoxia: Secondary | ICD-10-CM | POA: Diagnosis not present

## 2023-06-24 DIAGNOSIS — C50919 Malignant neoplasm of unspecified site of unspecified female breast: Secondary | ICD-10-CM | POA: Diagnosis not present

## 2023-06-27 DIAGNOSIS — C50912 Malignant neoplasm of unspecified site of left female breast: Secondary | ICD-10-CM | POA: Diagnosis not present

## 2023-06-27 DIAGNOSIS — E875 Hyperkalemia: Secondary | ICD-10-CM | POA: Diagnosis not present

## 2023-06-27 DIAGNOSIS — R1310 Dysphagia, unspecified: Secondary | ICD-10-CM | POA: Diagnosis not present

## 2023-06-27 DIAGNOSIS — J9601 Acute respiratory failure with hypoxia: Secondary | ICD-10-CM | POA: Diagnosis not present

## 2023-06-27 DIAGNOSIS — E0501 Thyrotoxicosis with diffuse goiter with thyrotoxic crisis or storm: Secondary | ICD-10-CM | POA: Diagnosis not present

## 2023-06-27 DIAGNOSIS — J441 Chronic obstructive pulmonary disease with (acute) exacerbation: Secondary | ICD-10-CM | POA: Diagnosis not present

## 2023-06-27 DIAGNOSIS — D696 Thrombocytopenia, unspecified: Secondary | ICD-10-CM | POA: Diagnosis not present

## 2023-06-27 DIAGNOSIS — C50919 Malignant neoplasm of unspecified site of unspecified female breast: Secondary | ICD-10-CM | POA: Diagnosis not present

## 2023-06-27 DIAGNOSIS — Z430 Encounter for attention to tracheostomy: Secondary | ICD-10-CM | POA: Diagnosis not present

## 2023-06-28 DIAGNOSIS — E0501 Thyrotoxicosis with diffuse goiter with thyrotoxic crisis or storm: Secondary | ICD-10-CM | POA: Diagnosis not present

## 2023-06-28 DIAGNOSIS — J9601 Acute respiratory failure with hypoxia: Secondary | ICD-10-CM | POA: Diagnosis not present

## 2023-06-28 DIAGNOSIS — J441 Chronic obstructive pulmonary disease with (acute) exacerbation: Secondary | ICD-10-CM | POA: Diagnosis not present

## 2023-06-28 DIAGNOSIS — R5381 Other malaise: Secondary | ICD-10-CM | POA: Diagnosis not present

## 2023-06-28 DIAGNOSIS — Z430 Encounter for attention to tracheostomy: Secondary | ICD-10-CM | POA: Diagnosis not present

## 2023-06-28 DIAGNOSIS — D696 Thrombocytopenia, unspecified: Secondary | ICD-10-CM | POA: Diagnosis not present

## 2023-06-28 DIAGNOSIS — E875 Hyperkalemia: Secondary | ICD-10-CM | POA: Diagnosis not present

## 2023-06-28 DIAGNOSIS — J449 Chronic obstructive pulmonary disease, unspecified: Secondary | ICD-10-CM | POA: Diagnosis not present

## 2023-06-28 DIAGNOSIS — C50919 Malignant neoplasm of unspecified site of unspecified female breast: Secondary | ICD-10-CM | POA: Diagnosis not present

## 2023-06-28 DIAGNOSIS — C50912 Malignant neoplasm of unspecified site of left female breast: Secondary | ICD-10-CM | POA: Diagnosis not present

## 2023-06-28 DIAGNOSIS — E43 Unspecified severe protein-calorie malnutrition: Secondary | ICD-10-CM | POA: Diagnosis not present

## 2023-06-28 DIAGNOSIS — R1310 Dysphagia, unspecified: Secondary | ICD-10-CM | POA: Diagnosis not present

## 2023-06-29 DIAGNOSIS — R1310 Dysphagia, unspecified: Secondary | ICD-10-CM | POA: Diagnosis not present

## 2023-06-29 DIAGNOSIS — E0501 Thyrotoxicosis with diffuse goiter with thyrotoxic crisis or storm: Secondary | ICD-10-CM | POA: Diagnosis not present

## 2023-06-29 DIAGNOSIS — E875 Hyperkalemia: Secondary | ICD-10-CM | POA: Diagnosis not present

## 2023-06-29 DIAGNOSIS — C50912 Malignant neoplasm of unspecified site of left female breast: Secondary | ICD-10-CM | POA: Diagnosis not present

## 2023-06-29 DIAGNOSIS — J441 Chronic obstructive pulmonary disease with (acute) exacerbation: Secondary | ICD-10-CM | POA: Diagnosis not present

## 2023-06-29 DIAGNOSIS — D696 Thrombocytopenia, unspecified: Secondary | ICD-10-CM | POA: Diagnosis not present

## 2023-06-29 DIAGNOSIS — C50919 Malignant neoplasm of unspecified site of unspecified female breast: Secondary | ICD-10-CM | POA: Diagnosis not present

## 2023-06-29 DIAGNOSIS — Z430 Encounter for attention to tracheostomy: Secondary | ICD-10-CM | POA: Diagnosis not present

## 2023-06-29 DIAGNOSIS — J9601 Acute respiratory failure with hypoxia: Secondary | ICD-10-CM | POA: Diagnosis not present

## 2023-06-30 DIAGNOSIS — C50919 Malignant neoplasm of unspecified site of unspecified female breast: Secondary | ICD-10-CM | POA: Diagnosis not present

## 2023-06-30 DIAGNOSIS — J441 Chronic obstructive pulmonary disease with (acute) exacerbation: Secondary | ICD-10-CM | POA: Diagnosis not present

## 2023-06-30 DIAGNOSIS — E875 Hyperkalemia: Secondary | ICD-10-CM | POA: Diagnosis not present

## 2023-06-30 DIAGNOSIS — D696 Thrombocytopenia, unspecified: Secondary | ICD-10-CM | POA: Diagnosis not present

## 2023-06-30 DIAGNOSIS — J9601 Acute respiratory failure with hypoxia: Secondary | ICD-10-CM | POA: Diagnosis not present

## 2023-06-30 DIAGNOSIS — C50912 Malignant neoplasm of unspecified site of left female breast: Secondary | ICD-10-CM | POA: Diagnosis not present

## 2023-06-30 DIAGNOSIS — E0501 Thyrotoxicosis with diffuse goiter with thyrotoxic crisis or storm: Secondary | ICD-10-CM | POA: Diagnosis not present

## 2023-06-30 DIAGNOSIS — Z430 Encounter for attention to tracheostomy: Secondary | ICD-10-CM | POA: Diagnosis not present

## 2023-06-30 DIAGNOSIS — R1310 Dysphagia, unspecified: Secondary | ICD-10-CM | POA: Diagnosis not present

## 2023-07-01 DIAGNOSIS — C50919 Malignant neoplasm of unspecified site of unspecified female breast: Secondary | ICD-10-CM | POA: Diagnosis not present

## 2023-07-01 DIAGNOSIS — Z430 Encounter for attention to tracheostomy: Secondary | ICD-10-CM | POA: Diagnosis not present

## 2023-07-01 DIAGNOSIS — D696 Thrombocytopenia, unspecified: Secondary | ICD-10-CM | POA: Diagnosis not present

## 2023-07-01 DIAGNOSIS — R1310 Dysphagia, unspecified: Secondary | ICD-10-CM | POA: Diagnosis not present

## 2023-07-01 DIAGNOSIS — R0989 Other specified symptoms and signs involving the circulatory and respiratory systems: Secondary | ICD-10-CM | POA: Diagnosis not present

## 2023-07-01 DIAGNOSIS — C50912 Malignant neoplasm of unspecified site of left female breast: Secondary | ICD-10-CM | POA: Diagnosis not present

## 2023-07-01 DIAGNOSIS — J441 Chronic obstructive pulmonary disease with (acute) exacerbation: Secondary | ICD-10-CM | POA: Diagnosis not present

## 2023-07-01 DIAGNOSIS — J9601 Acute respiratory failure with hypoxia: Secondary | ICD-10-CM | POA: Diagnosis not present

## 2023-07-01 DIAGNOSIS — E0501 Thyrotoxicosis with diffuse goiter with thyrotoxic crisis or storm: Secondary | ICD-10-CM | POA: Diagnosis not present

## 2023-07-01 DIAGNOSIS — E875 Hyperkalemia: Secondary | ICD-10-CM | POA: Diagnosis not present

## 2023-07-02 DIAGNOSIS — J9601 Acute respiratory failure with hypoxia: Secondary | ICD-10-CM | POA: Diagnosis not present

## 2023-07-02 DIAGNOSIS — J441 Chronic obstructive pulmonary disease with (acute) exacerbation: Secondary | ICD-10-CM | POA: Diagnosis not present

## 2023-07-02 DIAGNOSIS — C50912 Malignant neoplasm of unspecified site of left female breast: Secondary | ICD-10-CM | POA: Diagnosis not present

## 2023-07-02 DIAGNOSIS — R5381 Other malaise: Secondary | ICD-10-CM | POA: Diagnosis not present

## 2023-07-02 DIAGNOSIS — R1310 Dysphagia, unspecified: Secondary | ICD-10-CM | POA: Diagnosis not present

## 2023-07-02 DIAGNOSIS — E875 Hyperkalemia: Secondary | ICD-10-CM | POA: Diagnosis not present

## 2023-07-02 DIAGNOSIS — E43 Unspecified severe protein-calorie malnutrition: Secondary | ICD-10-CM | POA: Diagnosis not present

## 2023-07-02 DIAGNOSIS — E0501 Thyrotoxicosis with diffuse goiter with thyrotoxic crisis or storm: Secondary | ICD-10-CM | POA: Diagnosis not present

## 2023-07-02 DIAGNOSIS — Z430 Encounter for attention to tracheostomy: Secondary | ICD-10-CM | POA: Diagnosis not present

## 2023-07-02 DIAGNOSIS — R079 Chest pain, unspecified: Secondary | ICD-10-CM | POA: Diagnosis not present

## 2023-07-02 DIAGNOSIS — R109 Unspecified abdominal pain: Secondary | ICD-10-CM | POA: Diagnosis not present

## 2023-07-02 DIAGNOSIS — D696 Thrombocytopenia, unspecified: Secondary | ICD-10-CM | POA: Diagnosis not present

## 2023-07-02 DIAGNOSIS — J449 Chronic obstructive pulmonary disease, unspecified: Secondary | ICD-10-CM | POA: Diagnosis not present

## 2023-07-02 DIAGNOSIS — R918 Other nonspecific abnormal finding of lung field: Secondary | ICD-10-CM | POA: Diagnosis not present

## 2023-07-02 DIAGNOSIS — C50919 Malignant neoplasm of unspecified site of unspecified female breast: Secondary | ICD-10-CM | POA: Diagnosis not present

## 2023-07-03 DIAGNOSIS — R079 Chest pain, unspecified: Secondary | ICD-10-CM | POA: Diagnosis not present

## 2023-07-04 DIAGNOSIS — C50912 Malignant neoplasm of unspecified site of left female breast: Secondary | ICD-10-CM | POA: Diagnosis not present

## 2023-07-04 DIAGNOSIS — D696 Thrombocytopenia, unspecified: Secondary | ICD-10-CM | POA: Diagnosis not present

## 2023-07-04 DIAGNOSIS — Z430 Encounter for attention to tracheostomy: Secondary | ICD-10-CM | POA: Diagnosis not present

## 2023-07-04 DIAGNOSIS — R1310 Dysphagia, unspecified: Secondary | ICD-10-CM | POA: Diagnosis not present

## 2023-07-04 DIAGNOSIS — C50919 Malignant neoplasm of unspecified site of unspecified female breast: Secondary | ICD-10-CM | POA: Diagnosis not present

## 2023-07-04 DIAGNOSIS — E875 Hyperkalemia: Secondary | ICD-10-CM | POA: Diagnosis not present

## 2023-07-04 DIAGNOSIS — J441 Chronic obstructive pulmonary disease with (acute) exacerbation: Secondary | ICD-10-CM | POA: Diagnosis not present

## 2023-07-04 DIAGNOSIS — E0501 Thyrotoxicosis with diffuse goiter with thyrotoxic crisis or storm: Secondary | ICD-10-CM | POA: Diagnosis not present

## 2023-07-04 DIAGNOSIS — J9601 Acute respiratory failure with hypoxia: Secondary | ICD-10-CM | POA: Diagnosis not present

## 2023-07-05 DIAGNOSIS — J441 Chronic obstructive pulmonary disease with (acute) exacerbation: Secondary | ICD-10-CM | POA: Diagnosis not present

## 2023-07-05 DIAGNOSIS — E0501 Thyrotoxicosis with diffuse goiter with thyrotoxic crisis or storm: Secondary | ICD-10-CM | POA: Diagnosis not present

## 2023-07-05 DIAGNOSIS — C50919 Malignant neoplasm of unspecified site of unspecified female breast: Secondary | ICD-10-CM | POA: Diagnosis not present

## 2023-07-05 DIAGNOSIS — C50912 Malignant neoplasm of unspecified site of left female breast: Secondary | ICD-10-CM | POA: Diagnosis not present

## 2023-07-05 DIAGNOSIS — E875 Hyperkalemia: Secondary | ICD-10-CM | POA: Diagnosis not present

## 2023-07-05 DIAGNOSIS — J9601 Acute respiratory failure with hypoxia: Secondary | ICD-10-CM | POA: Diagnosis not present

## 2023-07-05 DIAGNOSIS — Z430 Encounter for attention to tracheostomy: Secondary | ICD-10-CM | POA: Diagnosis not present

## 2023-07-05 DIAGNOSIS — R1310 Dysphagia, unspecified: Secondary | ICD-10-CM | POA: Diagnosis not present

## 2023-07-05 DIAGNOSIS — D696 Thrombocytopenia, unspecified: Secondary | ICD-10-CM | POA: Diagnosis not present

## 2023-07-06 DIAGNOSIS — J9601 Acute respiratory failure with hypoxia: Secondary | ICD-10-CM | POA: Diagnosis not present

## 2023-07-06 DIAGNOSIS — C50912 Malignant neoplasm of unspecified site of left female breast: Secondary | ICD-10-CM | POA: Diagnosis not present

## 2023-07-06 DIAGNOSIS — Z430 Encounter for attention to tracheostomy: Secondary | ICD-10-CM | POA: Diagnosis not present

## 2023-07-06 DIAGNOSIS — D696 Thrombocytopenia, unspecified: Secondary | ICD-10-CM | POA: Diagnosis not present

## 2023-07-06 DIAGNOSIS — E875 Hyperkalemia: Secondary | ICD-10-CM | POA: Diagnosis not present

## 2023-07-06 DIAGNOSIS — C50919 Malignant neoplasm of unspecified site of unspecified female breast: Secondary | ICD-10-CM | POA: Diagnosis not present

## 2023-07-06 DIAGNOSIS — R1310 Dysphagia, unspecified: Secondary | ICD-10-CM | POA: Diagnosis not present

## 2023-07-06 DIAGNOSIS — E0501 Thyrotoxicosis with diffuse goiter with thyrotoxic crisis or storm: Secondary | ICD-10-CM | POA: Diagnosis not present

## 2023-07-06 DIAGNOSIS — J441 Chronic obstructive pulmonary disease with (acute) exacerbation: Secondary | ICD-10-CM | POA: Diagnosis not present

## 2023-07-07 DIAGNOSIS — E875 Hyperkalemia: Secondary | ICD-10-CM | POA: Diagnosis not present

## 2023-07-07 DIAGNOSIS — C50912 Malignant neoplasm of unspecified site of left female breast: Secondary | ICD-10-CM | POA: Diagnosis not present

## 2023-07-07 DIAGNOSIS — E0501 Thyrotoxicosis with diffuse goiter with thyrotoxic crisis or storm: Secondary | ICD-10-CM | POA: Diagnosis not present

## 2023-07-07 DIAGNOSIS — C50919 Malignant neoplasm of unspecified site of unspecified female breast: Secondary | ICD-10-CM | POA: Diagnosis not present

## 2023-07-07 DIAGNOSIS — J441 Chronic obstructive pulmonary disease with (acute) exacerbation: Secondary | ICD-10-CM | POA: Diagnosis not present

## 2023-07-07 DIAGNOSIS — D696 Thrombocytopenia, unspecified: Secondary | ICD-10-CM | POA: Diagnosis not present

## 2023-07-07 DIAGNOSIS — R1310 Dysphagia, unspecified: Secondary | ICD-10-CM | POA: Diagnosis not present

## 2023-07-07 DIAGNOSIS — J9601 Acute respiratory failure with hypoxia: Secondary | ICD-10-CM | POA: Diagnosis not present

## 2023-07-07 DIAGNOSIS — Z430 Encounter for attention to tracheostomy: Secondary | ICD-10-CM | POA: Diagnosis not present

## 2023-07-08 DIAGNOSIS — Z430 Encounter for attention to tracheostomy: Secondary | ICD-10-CM | POA: Diagnosis not present

## 2023-07-08 DIAGNOSIS — C50912 Malignant neoplasm of unspecified site of left female breast: Secondary | ICD-10-CM | POA: Diagnosis not present

## 2023-07-08 DIAGNOSIS — E875 Hyperkalemia: Secondary | ICD-10-CM | POA: Diagnosis not present

## 2023-07-08 DIAGNOSIS — J441 Chronic obstructive pulmonary disease with (acute) exacerbation: Secondary | ICD-10-CM | POA: Diagnosis not present

## 2023-07-08 DIAGNOSIS — C50919 Malignant neoplasm of unspecified site of unspecified female breast: Secondary | ICD-10-CM | POA: Diagnosis not present

## 2023-07-08 DIAGNOSIS — E0501 Thyrotoxicosis with diffuse goiter with thyrotoxic crisis or storm: Secondary | ICD-10-CM | POA: Diagnosis not present

## 2023-07-08 DIAGNOSIS — J9601 Acute respiratory failure with hypoxia: Secondary | ICD-10-CM | POA: Diagnosis not present

## 2023-07-08 DIAGNOSIS — D696 Thrombocytopenia, unspecified: Secondary | ICD-10-CM | POA: Diagnosis not present

## 2023-07-08 DIAGNOSIS — R1310 Dysphagia, unspecified: Secondary | ICD-10-CM | POA: Diagnosis not present

## 2023-07-09 DIAGNOSIS — Z430 Encounter for attention to tracheostomy: Secondary | ICD-10-CM | POA: Diagnosis not present

## 2023-07-09 DIAGNOSIS — R1310 Dysphagia, unspecified: Secondary | ICD-10-CM | POA: Diagnosis not present

## 2023-07-09 DIAGNOSIS — E875 Hyperkalemia: Secondary | ICD-10-CM | POA: Diagnosis not present

## 2023-07-09 DIAGNOSIS — J441 Chronic obstructive pulmonary disease with (acute) exacerbation: Secondary | ICD-10-CM | POA: Diagnosis not present

## 2023-07-09 DIAGNOSIS — J9601 Acute respiratory failure with hypoxia: Secondary | ICD-10-CM | POA: Diagnosis not present

## 2023-07-09 DIAGNOSIS — C50919 Malignant neoplasm of unspecified site of unspecified female breast: Secondary | ICD-10-CM | POA: Diagnosis not present

## 2023-07-09 DIAGNOSIS — C50912 Malignant neoplasm of unspecified site of left female breast: Secondary | ICD-10-CM | POA: Diagnosis not present

## 2023-07-09 DIAGNOSIS — D696 Thrombocytopenia, unspecified: Secondary | ICD-10-CM | POA: Diagnosis not present

## 2023-07-09 DIAGNOSIS — E0501 Thyrotoxicosis with diffuse goiter with thyrotoxic crisis or storm: Secondary | ICD-10-CM | POA: Diagnosis not present

## 2023-07-11 DIAGNOSIS — R1310 Dysphagia, unspecified: Secondary | ICD-10-CM | POA: Diagnosis not present

## 2023-07-11 DIAGNOSIS — Z430 Encounter for attention to tracheostomy: Secondary | ICD-10-CM | POA: Diagnosis not present

## 2023-07-11 DIAGNOSIS — E0501 Thyrotoxicosis with diffuse goiter with thyrotoxic crisis or storm: Secondary | ICD-10-CM | POA: Diagnosis not present

## 2023-07-11 DIAGNOSIS — C50919 Malignant neoplasm of unspecified site of unspecified female breast: Secondary | ICD-10-CM | POA: Diagnosis not present

## 2023-07-11 DIAGNOSIS — C50912 Malignant neoplasm of unspecified site of left female breast: Secondary | ICD-10-CM | POA: Diagnosis not present

## 2023-07-11 DIAGNOSIS — D696 Thrombocytopenia, unspecified: Secondary | ICD-10-CM | POA: Diagnosis not present

## 2023-07-11 DIAGNOSIS — J9601 Acute respiratory failure with hypoxia: Secondary | ICD-10-CM | POA: Diagnosis not present

## 2023-07-11 DIAGNOSIS — J441 Chronic obstructive pulmonary disease with (acute) exacerbation: Secondary | ICD-10-CM | POA: Diagnosis not present

## 2023-07-11 DIAGNOSIS — E875 Hyperkalemia: Secondary | ICD-10-CM | POA: Diagnosis not present

## 2023-07-12 DIAGNOSIS — Z430 Encounter for attention to tracheostomy: Secondary | ICD-10-CM | POA: Diagnosis not present

## 2023-07-12 DIAGNOSIS — J449 Chronic obstructive pulmonary disease, unspecified: Secondary | ICD-10-CM | POA: Diagnosis not present

## 2023-07-12 DIAGNOSIS — R5381 Other malaise: Secondary | ICD-10-CM | POA: Diagnosis not present

## 2023-07-12 DIAGNOSIS — E875 Hyperkalemia: Secondary | ICD-10-CM | POA: Diagnosis not present

## 2023-07-12 DIAGNOSIS — D696 Thrombocytopenia, unspecified: Secondary | ICD-10-CM | POA: Diagnosis not present

## 2023-07-12 DIAGNOSIS — E43 Unspecified severe protein-calorie malnutrition: Secondary | ICD-10-CM | POA: Diagnosis not present

## 2023-07-12 DIAGNOSIS — J9601 Acute respiratory failure with hypoxia: Secondary | ICD-10-CM | POA: Diagnosis not present

## 2023-07-12 DIAGNOSIS — C50919 Malignant neoplasm of unspecified site of unspecified female breast: Secondary | ICD-10-CM | POA: Diagnosis not present

## 2023-07-12 DIAGNOSIS — C50912 Malignant neoplasm of unspecified site of left female breast: Secondary | ICD-10-CM | POA: Diagnosis not present

## 2023-07-12 DIAGNOSIS — R918 Other nonspecific abnormal finding of lung field: Secondary | ICD-10-CM | POA: Diagnosis not present

## 2023-07-12 DIAGNOSIS — J441 Chronic obstructive pulmonary disease with (acute) exacerbation: Secondary | ICD-10-CM | POA: Diagnosis not present

## 2023-07-12 DIAGNOSIS — R1310 Dysphagia, unspecified: Secondary | ICD-10-CM | POA: Diagnosis not present

## 2023-07-12 DIAGNOSIS — E0501 Thyrotoxicosis with diffuse goiter with thyrotoxic crisis or storm: Secondary | ICD-10-CM | POA: Diagnosis not present

## 2023-07-13 DIAGNOSIS — C50919 Malignant neoplasm of unspecified site of unspecified female breast: Secondary | ICD-10-CM | POA: Diagnosis not present

## 2023-07-13 DIAGNOSIS — D696 Thrombocytopenia, unspecified: Secondary | ICD-10-CM | POA: Diagnosis not present

## 2023-07-13 DIAGNOSIS — Z430 Encounter for attention to tracheostomy: Secondary | ICD-10-CM | POA: Diagnosis not present

## 2023-07-13 DIAGNOSIS — C50912 Malignant neoplasm of unspecified site of left female breast: Secondary | ICD-10-CM | POA: Diagnosis not present

## 2023-07-13 DIAGNOSIS — J441 Chronic obstructive pulmonary disease with (acute) exacerbation: Secondary | ICD-10-CM | POA: Diagnosis not present

## 2023-07-13 DIAGNOSIS — R1310 Dysphagia, unspecified: Secondary | ICD-10-CM | POA: Diagnosis not present

## 2023-07-13 DIAGNOSIS — J9601 Acute respiratory failure with hypoxia: Secondary | ICD-10-CM | POA: Diagnosis not present

## 2023-07-13 DIAGNOSIS — E875 Hyperkalemia: Secondary | ICD-10-CM | POA: Diagnosis not present

## 2023-07-13 DIAGNOSIS — E0501 Thyrotoxicosis with diffuse goiter with thyrotoxic crisis or storm: Secondary | ICD-10-CM | POA: Diagnosis not present

## 2023-07-14 DIAGNOSIS — C50919 Malignant neoplasm of unspecified site of unspecified female breast: Secondary | ICD-10-CM | POA: Diagnosis not present

## 2023-07-14 DIAGNOSIS — J9601 Acute respiratory failure with hypoxia: Secondary | ICD-10-CM | POA: Diagnosis not present

## 2023-07-14 DIAGNOSIS — E875 Hyperkalemia: Secondary | ICD-10-CM | POA: Diagnosis not present

## 2023-07-14 DIAGNOSIS — R1310 Dysphagia, unspecified: Secondary | ICD-10-CM | POA: Diagnosis not present

## 2023-07-14 DIAGNOSIS — J441 Chronic obstructive pulmonary disease with (acute) exacerbation: Secondary | ICD-10-CM | POA: Diagnosis not present

## 2023-07-14 DIAGNOSIS — E0501 Thyrotoxicosis with diffuse goiter with thyrotoxic crisis or storm: Secondary | ICD-10-CM | POA: Diagnosis not present

## 2023-07-14 DIAGNOSIS — C50912 Malignant neoplasm of unspecified site of left female breast: Secondary | ICD-10-CM | POA: Diagnosis not present

## 2023-07-14 DIAGNOSIS — Z430 Encounter for attention to tracheostomy: Secondary | ICD-10-CM | POA: Diagnosis not present

## 2023-07-14 DIAGNOSIS — E43 Unspecified severe protein-calorie malnutrition: Secondary | ICD-10-CM | POA: Diagnosis not present

## 2023-07-14 DIAGNOSIS — D696 Thrombocytopenia, unspecified: Secondary | ICD-10-CM | POA: Diagnosis not present

## 2023-07-14 DIAGNOSIS — R5381 Other malaise: Secondary | ICD-10-CM | POA: Diagnosis not present

## 2023-07-14 DIAGNOSIS — J449 Chronic obstructive pulmonary disease, unspecified: Secondary | ICD-10-CM | POA: Diagnosis not present

## 2023-07-15 DIAGNOSIS — J441 Chronic obstructive pulmonary disease with (acute) exacerbation: Secondary | ICD-10-CM | POA: Diagnosis not present

## 2023-07-15 DIAGNOSIS — C50919 Malignant neoplasm of unspecified site of unspecified female breast: Secondary | ICD-10-CM | POA: Diagnosis not present

## 2023-07-15 DIAGNOSIS — E875 Hyperkalemia: Secondary | ICD-10-CM | POA: Diagnosis not present

## 2023-07-15 DIAGNOSIS — Z430 Encounter for attention to tracheostomy: Secondary | ICD-10-CM | POA: Diagnosis not present

## 2023-07-15 DIAGNOSIS — R1310 Dysphagia, unspecified: Secondary | ICD-10-CM | POA: Diagnosis not present

## 2023-07-15 DIAGNOSIS — D696 Thrombocytopenia, unspecified: Secondary | ICD-10-CM | POA: Diagnosis not present

## 2023-07-15 DIAGNOSIS — E0501 Thyrotoxicosis with diffuse goiter with thyrotoxic crisis or storm: Secondary | ICD-10-CM | POA: Diagnosis not present

## 2023-07-15 DIAGNOSIS — J9601 Acute respiratory failure with hypoxia: Secondary | ICD-10-CM | POA: Diagnosis not present

## 2023-07-15 DIAGNOSIS — C50912 Malignant neoplasm of unspecified site of left female breast: Secondary | ICD-10-CM | POA: Diagnosis not present

## 2023-07-18 DIAGNOSIS — J441 Chronic obstructive pulmonary disease with (acute) exacerbation: Secondary | ICD-10-CM | POA: Diagnosis not present

## 2023-07-18 DIAGNOSIS — C50912 Malignant neoplasm of unspecified site of left female breast: Secondary | ICD-10-CM | POA: Diagnosis not present

## 2023-07-18 DIAGNOSIS — E875 Hyperkalemia: Secondary | ICD-10-CM | POA: Diagnosis not present

## 2023-07-18 DIAGNOSIS — J9601 Acute respiratory failure with hypoxia: Secondary | ICD-10-CM | POA: Diagnosis not present

## 2023-07-18 DIAGNOSIS — D696 Thrombocytopenia, unspecified: Secondary | ICD-10-CM | POA: Diagnosis not present

## 2023-07-18 DIAGNOSIS — C50919 Malignant neoplasm of unspecified site of unspecified female breast: Secondary | ICD-10-CM | POA: Diagnosis not present

## 2023-07-18 DIAGNOSIS — E0501 Thyrotoxicosis with diffuse goiter with thyrotoxic crisis or storm: Secondary | ICD-10-CM | POA: Diagnosis not present

## 2023-07-18 DIAGNOSIS — R1310 Dysphagia, unspecified: Secondary | ICD-10-CM | POA: Diagnosis not present

## 2023-07-18 DIAGNOSIS — Z430 Encounter for attention to tracheostomy: Secondary | ICD-10-CM | POA: Diagnosis not present

## 2023-07-19 DIAGNOSIS — C50912 Malignant neoplasm of unspecified site of left female breast: Secondary | ICD-10-CM | POA: Diagnosis not present

## 2023-07-19 DIAGNOSIS — E0501 Thyrotoxicosis with diffuse goiter with thyrotoxic crisis or storm: Secondary | ICD-10-CM | POA: Diagnosis not present

## 2023-07-19 DIAGNOSIS — D696 Thrombocytopenia, unspecified: Secondary | ICD-10-CM | POA: Diagnosis not present

## 2023-07-19 DIAGNOSIS — R1310 Dysphagia, unspecified: Secondary | ICD-10-CM | POA: Diagnosis not present

## 2023-07-19 DIAGNOSIS — C50919 Malignant neoplasm of unspecified site of unspecified female breast: Secondary | ICD-10-CM | POA: Diagnosis not present

## 2023-07-19 DIAGNOSIS — J441 Chronic obstructive pulmonary disease with (acute) exacerbation: Secondary | ICD-10-CM | POA: Diagnosis not present

## 2023-07-19 DIAGNOSIS — Z430 Encounter for attention to tracheostomy: Secondary | ICD-10-CM | POA: Diagnosis not present

## 2023-07-19 DIAGNOSIS — J9601 Acute respiratory failure with hypoxia: Secondary | ICD-10-CM | POA: Diagnosis not present

## 2023-07-19 DIAGNOSIS — E875 Hyperkalemia: Secondary | ICD-10-CM | POA: Diagnosis not present

## 2023-07-20 ENCOUNTER — Other Ambulatory Visit: Payer: Self-pay | Admitting: Internal Medicine

## 2023-07-20 DIAGNOSIS — R1310 Dysphagia, unspecified: Secondary | ICD-10-CM | POA: Diagnosis not present

## 2023-07-20 DIAGNOSIS — C50919 Malignant neoplasm of unspecified site of unspecified female breast: Secondary | ICD-10-CM | POA: Diagnosis not present

## 2023-07-20 DIAGNOSIS — E0501 Thyrotoxicosis with diffuse goiter with thyrotoxic crisis or storm: Secondary | ICD-10-CM | POA: Diagnosis not present

## 2023-07-20 DIAGNOSIS — D696 Thrombocytopenia, unspecified: Secondary | ICD-10-CM | POA: Diagnosis not present

## 2023-07-20 DIAGNOSIS — Z430 Encounter for attention to tracheostomy: Secondary | ICD-10-CM | POA: Diagnosis not present

## 2023-07-20 DIAGNOSIS — C50912 Malignant neoplasm of unspecified site of left female breast: Secondary | ICD-10-CM | POA: Diagnosis not present

## 2023-07-20 DIAGNOSIS — E875 Hyperkalemia: Secondary | ICD-10-CM | POA: Diagnosis not present

## 2023-07-20 DIAGNOSIS — J9601 Acute respiratory failure with hypoxia: Secondary | ICD-10-CM | POA: Diagnosis not present

## 2023-07-20 DIAGNOSIS — J441 Chronic obstructive pulmonary disease with (acute) exacerbation: Secondary | ICD-10-CM | POA: Diagnosis not present

## 2023-07-20 MED ORDER — OXYCODONE HCL 5 MG PO TABS: 5.0000 mg | ORAL_TABLET | Freq: Four times a day (QID) | ORAL | 0 refills | Status: AC | PRN

## 2023-07-21 DIAGNOSIS — E875 Hyperkalemia: Secondary | ICD-10-CM | POA: Diagnosis not present

## 2023-07-21 DIAGNOSIS — C50919 Malignant neoplasm of unspecified site of unspecified female breast: Secondary | ICD-10-CM | POA: Diagnosis not present

## 2023-07-21 DIAGNOSIS — J441 Chronic obstructive pulmonary disease with (acute) exacerbation: Secondary | ICD-10-CM | POA: Diagnosis not present

## 2023-07-21 DIAGNOSIS — D696 Thrombocytopenia, unspecified: Secondary | ICD-10-CM | POA: Diagnosis not present

## 2023-07-21 DIAGNOSIS — E0501 Thyrotoxicosis with diffuse goiter with thyrotoxic crisis or storm: Secondary | ICD-10-CM | POA: Diagnosis not present

## 2023-07-21 DIAGNOSIS — R1310 Dysphagia, unspecified: Secondary | ICD-10-CM | POA: Diagnosis not present

## 2023-07-21 DIAGNOSIS — Z430 Encounter for attention to tracheostomy: Secondary | ICD-10-CM | POA: Diagnosis not present

## 2023-07-21 DIAGNOSIS — J9601 Acute respiratory failure with hypoxia: Secondary | ICD-10-CM | POA: Diagnosis not present

## 2023-07-21 DIAGNOSIS — C50912 Malignant neoplasm of unspecified site of left female breast: Secondary | ICD-10-CM | POA: Diagnosis not present

## 2023-07-22 DIAGNOSIS — E875 Hyperkalemia: Secondary | ICD-10-CM | POA: Diagnosis not present

## 2023-07-22 DIAGNOSIS — Z430 Encounter for attention to tracheostomy: Secondary | ICD-10-CM | POA: Diagnosis not present

## 2023-07-22 DIAGNOSIS — C50912 Malignant neoplasm of unspecified site of left female breast: Secondary | ICD-10-CM | POA: Diagnosis not present

## 2023-07-22 DIAGNOSIS — E0501 Thyrotoxicosis with diffuse goiter with thyrotoxic crisis or storm: Secondary | ICD-10-CM | POA: Diagnosis not present

## 2023-07-22 DIAGNOSIS — C50919 Malignant neoplasm of unspecified site of unspecified female breast: Secondary | ICD-10-CM | POA: Diagnosis not present

## 2023-07-22 DIAGNOSIS — J441 Chronic obstructive pulmonary disease with (acute) exacerbation: Secondary | ICD-10-CM | POA: Diagnosis not present

## 2023-07-22 DIAGNOSIS — J9601 Acute respiratory failure with hypoxia: Secondary | ICD-10-CM | POA: Diagnosis not present

## 2023-07-22 DIAGNOSIS — R1310 Dysphagia, unspecified: Secondary | ICD-10-CM | POA: Diagnosis not present

## 2023-07-22 DIAGNOSIS — D696 Thrombocytopenia, unspecified: Secondary | ICD-10-CM | POA: Diagnosis not present

## 2023-07-25 DIAGNOSIS — R1310 Dysphagia, unspecified: Secondary | ICD-10-CM | POA: Diagnosis not present

## 2023-07-25 DIAGNOSIS — J9601 Acute respiratory failure with hypoxia: Secondary | ICD-10-CM | POA: Diagnosis not present

## 2023-07-25 DIAGNOSIS — J441 Chronic obstructive pulmonary disease with (acute) exacerbation: Secondary | ICD-10-CM | POA: Diagnosis not present

## 2023-07-25 DIAGNOSIS — C50919 Malignant neoplasm of unspecified site of unspecified female breast: Secondary | ICD-10-CM | POA: Diagnosis not present

## 2023-07-25 DIAGNOSIS — Z430 Encounter for attention to tracheostomy: Secondary | ICD-10-CM | POA: Diagnosis not present

## 2023-07-25 DIAGNOSIS — D696 Thrombocytopenia, unspecified: Secondary | ICD-10-CM | POA: Diagnosis not present

## 2023-07-25 DIAGNOSIS — C50912 Malignant neoplasm of unspecified site of left female breast: Secondary | ICD-10-CM | POA: Diagnosis not present

## 2023-07-25 DIAGNOSIS — E0501 Thyrotoxicosis with diffuse goiter with thyrotoxic crisis or storm: Secondary | ICD-10-CM | POA: Diagnosis not present

## 2023-07-25 DIAGNOSIS — E875 Hyperkalemia: Secondary | ICD-10-CM | POA: Diagnosis not present

## 2023-07-26 DIAGNOSIS — E43 Unspecified severe protein-calorie malnutrition: Secondary | ICD-10-CM | POA: Diagnosis not present

## 2023-07-26 DIAGNOSIS — D696 Thrombocytopenia, unspecified: Secondary | ICD-10-CM | POA: Diagnosis not present

## 2023-07-26 DIAGNOSIS — R1310 Dysphagia, unspecified: Secondary | ICD-10-CM | POA: Diagnosis not present

## 2023-07-26 DIAGNOSIS — J9601 Acute respiratory failure with hypoxia: Secondary | ICD-10-CM | POA: Diagnosis not present

## 2023-07-26 DIAGNOSIS — E875 Hyperkalemia: Secondary | ICD-10-CM | POA: Diagnosis not present

## 2023-07-26 DIAGNOSIS — Z430 Encounter for attention to tracheostomy: Secondary | ICD-10-CM | POA: Diagnosis not present

## 2023-07-26 DIAGNOSIS — J449 Chronic obstructive pulmonary disease, unspecified: Secondary | ICD-10-CM | POA: Diagnosis not present

## 2023-07-26 DIAGNOSIS — C50919 Malignant neoplasm of unspecified site of unspecified female breast: Secondary | ICD-10-CM | POA: Diagnosis not present

## 2023-07-26 DIAGNOSIS — J441 Chronic obstructive pulmonary disease with (acute) exacerbation: Secondary | ICD-10-CM | POA: Diagnosis not present

## 2023-07-26 DIAGNOSIS — R5381 Other malaise: Secondary | ICD-10-CM | POA: Diagnosis not present

## 2023-07-26 DIAGNOSIS — E0501 Thyrotoxicosis with diffuse goiter with thyrotoxic crisis or storm: Secondary | ICD-10-CM | POA: Diagnosis not present

## 2023-07-26 DIAGNOSIS — C50912 Malignant neoplasm of unspecified site of left female breast: Secondary | ICD-10-CM | POA: Diagnosis not present

## 2023-07-27 DIAGNOSIS — R1310 Dysphagia, unspecified: Secondary | ICD-10-CM | POA: Diagnosis not present

## 2023-07-27 DIAGNOSIS — E875 Hyperkalemia: Secondary | ICD-10-CM | POA: Diagnosis not present

## 2023-07-27 DIAGNOSIS — Z430 Encounter for attention to tracheostomy: Secondary | ICD-10-CM | POA: Diagnosis not present

## 2023-07-27 DIAGNOSIS — J441 Chronic obstructive pulmonary disease with (acute) exacerbation: Secondary | ICD-10-CM | POA: Diagnosis not present

## 2023-07-27 DIAGNOSIS — C50912 Malignant neoplasm of unspecified site of left female breast: Secondary | ICD-10-CM | POA: Diagnosis not present

## 2023-07-27 DIAGNOSIS — E0501 Thyrotoxicosis with diffuse goiter with thyrotoxic crisis or storm: Secondary | ICD-10-CM | POA: Diagnosis not present

## 2023-07-27 DIAGNOSIS — J9601 Acute respiratory failure with hypoxia: Secondary | ICD-10-CM | POA: Diagnosis not present

## 2023-07-27 DIAGNOSIS — D696 Thrombocytopenia, unspecified: Secondary | ICD-10-CM | POA: Diagnosis not present

## 2023-07-27 DIAGNOSIS — C50919 Malignant neoplasm of unspecified site of unspecified female breast: Secondary | ICD-10-CM | POA: Diagnosis not present

## 2023-07-28 DIAGNOSIS — R1310 Dysphagia, unspecified: Secondary | ICD-10-CM | POA: Diagnosis not present

## 2023-07-28 DIAGNOSIS — C50919 Malignant neoplasm of unspecified site of unspecified female breast: Secondary | ICD-10-CM | POA: Diagnosis not present

## 2023-07-28 DIAGNOSIS — C50912 Malignant neoplasm of unspecified site of left female breast: Secondary | ICD-10-CM | POA: Diagnosis not present

## 2023-07-28 DIAGNOSIS — D696 Thrombocytopenia, unspecified: Secondary | ICD-10-CM | POA: Diagnosis not present

## 2023-07-28 DIAGNOSIS — Z430 Encounter for attention to tracheostomy: Secondary | ICD-10-CM | POA: Diagnosis not present

## 2023-07-28 DIAGNOSIS — E875 Hyperkalemia: Secondary | ICD-10-CM | POA: Diagnosis not present

## 2023-07-28 DIAGNOSIS — E0501 Thyrotoxicosis with diffuse goiter with thyrotoxic crisis or storm: Secondary | ICD-10-CM | POA: Diagnosis not present

## 2023-07-28 DIAGNOSIS — J441 Chronic obstructive pulmonary disease with (acute) exacerbation: Secondary | ICD-10-CM | POA: Diagnosis not present

## 2023-07-28 DIAGNOSIS — J9601 Acute respiratory failure with hypoxia: Secondary | ICD-10-CM | POA: Diagnosis not present

## 2023-07-29 DIAGNOSIS — C50912 Malignant neoplasm of unspecified site of left female breast: Secondary | ICD-10-CM | POA: Diagnosis not present

## 2023-07-29 DIAGNOSIS — E875 Hyperkalemia: Secondary | ICD-10-CM | POA: Diagnosis not present

## 2023-07-29 DIAGNOSIS — E0501 Thyrotoxicosis with diffuse goiter with thyrotoxic crisis or storm: Secondary | ICD-10-CM | POA: Diagnosis not present

## 2023-07-29 DIAGNOSIS — R1310 Dysphagia, unspecified: Secondary | ICD-10-CM | POA: Diagnosis not present

## 2023-07-29 DIAGNOSIS — C50919 Malignant neoplasm of unspecified site of unspecified female breast: Secondary | ICD-10-CM | POA: Diagnosis not present

## 2023-07-29 DIAGNOSIS — J9601 Acute respiratory failure with hypoxia: Secondary | ICD-10-CM | POA: Diagnosis not present

## 2023-07-29 DIAGNOSIS — J441 Chronic obstructive pulmonary disease with (acute) exacerbation: Secondary | ICD-10-CM | POA: Diagnosis not present

## 2023-07-29 DIAGNOSIS — Z430 Encounter for attention to tracheostomy: Secondary | ICD-10-CM | POA: Diagnosis not present

## 2023-07-29 DIAGNOSIS — D696 Thrombocytopenia, unspecified: Secondary | ICD-10-CM | POA: Diagnosis not present

## 2023-07-31 DIAGNOSIS — R109 Unspecified abdominal pain: Secondary | ICD-10-CM | POA: Diagnosis not present

## 2023-07-31 DIAGNOSIS — E875 Hyperkalemia: Secondary | ICD-10-CM | POA: Diagnosis not present

## 2023-07-31 DIAGNOSIS — J441 Chronic obstructive pulmonary disease with (acute) exacerbation: Secondary | ICD-10-CM | POA: Diagnosis not present

## 2023-07-31 DIAGNOSIS — Z430 Encounter for attention to tracheostomy: Secondary | ICD-10-CM | POA: Diagnosis not present

## 2023-07-31 DIAGNOSIS — J9601 Acute respiratory failure with hypoxia: Secondary | ICD-10-CM | POA: Diagnosis not present

## 2023-07-31 DIAGNOSIS — E0501 Thyrotoxicosis with diffuse goiter with thyrotoxic crisis or storm: Secondary | ICD-10-CM | POA: Diagnosis not present

## 2023-07-31 DIAGNOSIS — C50919 Malignant neoplasm of unspecified site of unspecified female breast: Secondary | ICD-10-CM | POA: Diagnosis not present

## 2023-07-31 DIAGNOSIS — R1310 Dysphagia, unspecified: Secondary | ICD-10-CM | POA: Diagnosis not present

## 2023-07-31 DIAGNOSIS — D696 Thrombocytopenia, unspecified: Secondary | ICD-10-CM | POA: Diagnosis not present

## 2023-07-31 DIAGNOSIS — C50912 Malignant neoplasm of unspecified site of left female breast: Secondary | ICD-10-CM | POA: Diagnosis not present

## 2023-08-01 DIAGNOSIS — R1310 Dysphagia, unspecified: Secondary | ICD-10-CM | POA: Diagnosis not present

## 2023-08-01 DIAGNOSIS — Z430 Encounter for attention to tracheostomy: Secondary | ICD-10-CM | POA: Diagnosis not present

## 2023-08-01 DIAGNOSIS — C50912 Malignant neoplasm of unspecified site of left female breast: Secondary | ICD-10-CM | POA: Diagnosis not present

## 2023-08-01 DIAGNOSIS — E0501 Thyrotoxicosis with diffuse goiter with thyrotoxic crisis or storm: Secondary | ICD-10-CM | POA: Diagnosis not present

## 2023-08-01 DIAGNOSIS — J9601 Acute respiratory failure with hypoxia: Secondary | ICD-10-CM | POA: Diagnosis not present

## 2023-08-01 DIAGNOSIS — E875 Hyperkalemia: Secondary | ICD-10-CM | POA: Diagnosis not present

## 2023-08-01 DIAGNOSIS — C50919 Malignant neoplasm of unspecified site of unspecified female breast: Secondary | ICD-10-CM | POA: Diagnosis not present

## 2023-08-01 DIAGNOSIS — D696 Thrombocytopenia, unspecified: Secondary | ICD-10-CM | POA: Diagnosis not present

## 2023-08-01 DIAGNOSIS — R109 Unspecified abdominal pain: Secondary | ICD-10-CM | POA: Diagnosis not present

## 2023-08-01 DIAGNOSIS — J441 Chronic obstructive pulmonary disease with (acute) exacerbation: Secondary | ICD-10-CM | POA: Diagnosis not present

## 2023-08-01 DIAGNOSIS — R188 Other ascites: Secondary | ICD-10-CM | POA: Diagnosis not present

## 2023-08-02 ENCOUNTER — Other Ambulatory Visit: Payer: Self-pay | Admitting: Internal Medicine

## 2023-08-02 DIAGNOSIS — Z431 Encounter for attention to gastrostomy: Secondary | ICD-10-CM | POA: Diagnosis not present

## 2023-08-02 DIAGNOSIS — R498 Other voice and resonance disorders: Secondary | ICD-10-CM | POA: Diagnosis not present

## 2023-08-02 DIAGNOSIS — Z430 Encounter for attention to tracheostomy: Secondary | ICD-10-CM | POA: Diagnosis not present

## 2023-08-02 DIAGNOSIS — C50912 Malignant neoplasm of unspecified site of left female breast: Secondary | ICD-10-CM | POA: Diagnosis not present

## 2023-08-02 DIAGNOSIS — J9601 Acute respiratory failure with hypoxia: Secondary | ICD-10-CM | POA: Diagnosis not present

## 2023-08-02 DIAGNOSIS — L8996 Pressure-induced deep tissue damage of unspecified site: Secondary | ICD-10-CM | POA: Diagnosis not present

## 2023-08-02 DIAGNOSIS — R278 Other lack of coordination: Secondary | ICD-10-CM | POA: Diagnosis not present

## 2023-08-02 DIAGNOSIS — J441 Chronic obstructive pulmonary disease with (acute) exacerbation: Secondary | ICD-10-CM | POA: Diagnosis not present

## 2023-08-02 DIAGNOSIS — R262 Difficulty in walking, not elsewhere classified: Secondary | ICD-10-CM | POA: Diagnosis not present

## 2023-08-03 DIAGNOSIS — R498 Other voice and resonance disorders: Secondary | ICD-10-CM | POA: Diagnosis not present

## 2023-08-03 DIAGNOSIS — R278 Other lack of coordination: Secondary | ICD-10-CM | POA: Diagnosis not present

## 2023-08-03 DIAGNOSIS — L8996 Pressure-induced deep tissue damage of unspecified site: Secondary | ICD-10-CM | POA: Diagnosis not present

## 2023-08-03 DIAGNOSIS — Z431 Encounter for attention to gastrostomy: Secondary | ICD-10-CM | POA: Diagnosis not present

## 2023-08-03 DIAGNOSIS — J449 Chronic obstructive pulmonary disease, unspecified: Secondary | ICD-10-CM | POA: Diagnosis not present

## 2023-08-03 DIAGNOSIS — Z430 Encounter for attention to tracheostomy: Secondary | ICD-10-CM | POA: Diagnosis not present

## 2023-08-03 DIAGNOSIS — R262 Difficulty in walking, not elsewhere classified: Secondary | ICD-10-CM | POA: Diagnosis not present

## 2023-08-03 DIAGNOSIS — J441 Chronic obstructive pulmonary disease with (acute) exacerbation: Secondary | ICD-10-CM | POA: Diagnosis not present

## 2023-08-03 DIAGNOSIS — C50912 Malignant neoplasm of unspecified site of left female breast: Secondary | ICD-10-CM | POA: Diagnosis not present

## 2023-08-03 DIAGNOSIS — E43 Unspecified severe protein-calorie malnutrition: Secondary | ICD-10-CM | POA: Diagnosis not present

## 2023-08-03 DIAGNOSIS — R5381 Other malaise: Secondary | ICD-10-CM | POA: Diagnosis not present

## 2023-08-03 DIAGNOSIS — J9601 Acute respiratory failure with hypoxia: Secondary | ICD-10-CM | POA: Diagnosis not present

## 2023-08-04 DIAGNOSIS — Z431 Encounter for attention to gastrostomy: Secondary | ICD-10-CM | POA: Diagnosis not present

## 2023-08-04 DIAGNOSIS — Z430 Encounter for attention to tracheostomy: Secondary | ICD-10-CM | POA: Diagnosis not present

## 2023-08-04 DIAGNOSIS — J9601 Acute respiratory failure with hypoxia: Secondary | ICD-10-CM | POA: Diagnosis not present

## 2023-08-04 DIAGNOSIS — R278 Other lack of coordination: Secondary | ICD-10-CM | POA: Diagnosis not present

## 2023-08-04 DIAGNOSIS — C50912 Malignant neoplasm of unspecified site of left female breast: Secondary | ICD-10-CM | POA: Diagnosis not present

## 2023-08-04 DIAGNOSIS — L8996 Pressure-induced deep tissue damage of unspecified site: Secondary | ICD-10-CM | POA: Diagnosis not present

## 2023-08-04 DIAGNOSIS — R262 Difficulty in walking, not elsewhere classified: Secondary | ICD-10-CM | POA: Diagnosis not present

## 2023-08-04 DIAGNOSIS — R498 Other voice and resonance disorders: Secondary | ICD-10-CM | POA: Diagnosis not present

## 2023-08-04 DIAGNOSIS — J441 Chronic obstructive pulmonary disease with (acute) exacerbation: Secondary | ICD-10-CM | POA: Diagnosis not present

## 2023-08-05 DIAGNOSIS — Z431 Encounter for attention to gastrostomy: Secondary | ICD-10-CM | POA: Diagnosis not present

## 2023-08-05 DIAGNOSIS — Z430 Encounter for attention to tracheostomy: Secondary | ICD-10-CM | POA: Diagnosis not present

## 2023-08-05 DIAGNOSIS — J441 Chronic obstructive pulmonary disease with (acute) exacerbation: Secondary | ICD-10-CM | POA: Diagnosis not present

## 2023-08-05 DIAGNOSIS — C50912 Malignant neoplasm of unspecified site of left female breast: Secondary | ICD-10-CM | POA: Diagnosis not present

## 2023-08-05 DIAGNOSIS — R262 Difficulty in walking, not elsewhere classified: Secondary | ICD-10-CM | POA: Diagnosis not present

## 2023-08-05 DIAGNOSIS — R109 Unspecified abdominal pain: Secondary | ICD-10-CM | POA: Diagnosis not present

## 2023-08-05 DIAGNOSIS — J9601 Acute respiratory failure with hypoxia: Secondary | ICD-10-CM | POA: Diagnosis not present

## 2023-08-05 DIAGNOSIS — Z4659 Encounter for fitting and adjustment of other gastrointestinal appliance and device: Secondary | ICD-10-CM | POA: Diagnosis not present

## 2023-08-05 DIAGNOSIS — L8996 Pressure-induced deep tissue damage of unspecified site: Secondary | ICD-10-CM | POA: Diagnosis not present

## 2023-08-05 DIAGNOSIS — R498 Other voice and resonance disorders: Secondary | ICD-10-CM | POA: Diagnosis not present

## 2023-08-05 DIAGNOSIS — R278 Other lack of coordination: Secondary | ICD-10-CM | POA: Diagnosis not present

## 2023-08-06 DIAGNOSIS — Z431 Encounter for attention to gastrostomy: Secondary | ICD-10-CM | POA: Diagnosis not present

## 2023-08-06 DIAGNOSIS — J441 Chronic obstructive pulmonary disease with (acute) exacerbation: Secondary | ICD-10-CM | POA: Diagnosis not present

## 2023-08-06 DIAGNOSIS — L8996 Pressure-induced deep tissue damage of unspecified site: Secondary | ICD-10-CM | POA: Diagnosis not present

## 2023-08-06 DIAGNOSIS — Z430 Encounter for attention to tracheostomy: Secondary | ICD-10-CM | POA: Diagnosis not present

## 2023-08-06 DIAGNOSIS — R278 Other lack of coordination: Secondary | ICD-10-CM | POA: Diagnosis not present

## 2023-08-06 DIAGNOSIS — C50912 Malignant neoplasm of unspecified site of left female breast: Secondary | ICD-10-CM | POA: Diagnosis not present

## 2023-08-06 DIAGNOSIS — R262 Difficulty in walking, not elsewhere classified: Secondary | ICD-10-CM | POA: Diagnosis not present

## 2023-08-06 DIAGNOSIS — J9601 Acute respiratory failure with hypoxia: Secondary | ICD-10-CM | POA: Diagnosis not present

## 2023-08-06 DIAGNOSIS — R498 Other voice and resonance disorders: Secondary | ICD-10-CM | POA: Diagnosis not present

## 2023-08-07 DIAGNOSIS — Z431 Encounter for attention to gastrostomy: Secondary | ICD-10-CM | POA: Diagnosis not present

## 2023-08-07 DIAGNOSIS — C50912 Malignant neoplasm of unspecified site of left female breast: Secondary | ICD-10-CM | POA: Diagnosis not present

## 2023-08-07 DIAGNOSIS — R278 Other lack of coordination: Secondary | ICD-10-CM | POA: Diagnosis not present

## 2023-08-07 DIAGNOSIS — R262 Difficulty in walking, not elsewhere classified: Secondary | ICD-10-CM | POA: Diagnosis not present

## 2023-08-07 DIAGNOSIS — L8996 Pressure-induced deep tissue damage of unspecified site: Secondary | ICD-10-CM | POA: Diagnosis not present

## 2023-08-07 DIAGNOSIS — Z931 Gastrostomy status: Secondary | ICD-10-CM | POA: Diagnosis not present

## 2023-08-07 DIAGNOSIS — J9601 Acute respiratory failure with hypoxia: Secondary | ICD-10-CM | POA: Diagnosis not present

## 2023-08-07 DIAGNOSIS — R109 Unspecified abdominal pain: Secondary | ICD-10-CM | POA: Diagnosis not present

## 2023-08-07 DIAGNOSIS — J441 Chronic obstructive pulmonary disease with (acute) exacerbation: Secondary | ICD-10-CM | POA: Diagnosis not present

## 2023-08-07 DIAGNOSIS — Z430 Encounter for attention to tracheostomy: Secondary | ICD-10-CM | POA: Diagnosis not present

## 2023-08-07 DIAGNOSIS — R498 Other voice and resonance disorders: Secondary | ICD-10-CM | POA: Diagnosis not present

## 2023-08-08 DIAGNOSIS — R278 Other lack of coordination: Secondary | ICD-10-CM | POA: Diagnosis not present

## 2023-08-08 DIAGNOSIS — Z430 Encounter for attention to tracheostomy: Secondary | ICD-10-CM | POA: Diagnosis not present

## 2023-08-08 DIAGNOSIS — L8996 Pressure-induced deep tissue damage of unspecified site: Secondary | ICD-10-CM | POA: Diagnosis not present

## 2023-08-08 DIAGNOSIS — Z431 Encounter for attention to gastrostomy: Secondary | ICD-10-CM | POA: Diagnosis not present

## 2023-08-08 DIAGNOSIS — R262 Difficulty in walking, not elsewhere classified: Secondary | ICD-10-CM | POA: Diagnosis not present

## 2023-08-08 DIAGNOSIS — J441 Chronic obstructive pulmonary disease with (acute) exacerbation: Secondary | ICD-10-CM | POA: Diagnosis not present

## 2023-08-08 DIAGNOSIS — R498 Other voice and resonance disorders: Secondary | ICD-10-CM | POA: Diagnosis not present

## 2023-08-08 DIAGNOSIS — J9601 Acute respiratory failure with hypoxia: Secondary | ICD-10-CM | POA: Diagnosis not present

## 2023-08-08 DIAGNOSIS — C50912 Malignant neoplasm of unspecified site of left female breast: Secondary | ICD-10-CM | POA: Diagnosis not present

## 2023-08-09 DIAGNOSIS — Z431 Encounter for attention to gastrostomy: Secondary | ICD-10-CM | POA: Diagnosis not present

## 2023-08-09 DIAGNOSIS — R5381 Other malaise: Secondary | ICD-10-CM | POA: Diagnosis not present

## 2023-08-09 DIAGNOSIS — R262 Difficulty in walking, not elsewhere classified: Secondary | ICD-10-CM | POA: Diagnosis not present

## 2023-08-09 DIAGNOSIS — J9601 Acute respiratory failure with hypoxia: Secondary | ICD-10-CM | POA: Diagnosis not present

## 2023-08-09 DIAGNOSIS — J449 Chronic obstructive pulmonary disease, unspecified: Secondary | ICD-10-CM | POA: Diagnosis not present

## 2023-08-09 DIAGNOSIS — Z430 Encounter for attention to tracheostomy: Secondary | ICD-10-CM | POA: Diagnosis not present

## 2023-08-09 DIAGNOSIS — C50912 Malignant neoplasm of unspecified site of left female breast: Secondary | ICD-10-CM | POA: Diagnosis not present

## 2023-08-09 DIAGNOSIS — L8996 Pressure-induced deep tissue damage of unspecified site: Secondary | ICD-10-CM | POA: Diagnosis not present

## 2023-08-09 DIAGNOSIS — R278 Other lack of coordination: Secondary | ICD-10-CM | POA: Diagnosis not present

## 2023-08-09 DIAGNOSIS — J441 Chronic obstructive pulmonary disease with (acute) exacerbation: Secondary | ICD-10-CM | POA: Diagnosis not present

## 2023-08-09 DIAGNOSIS — E43 Unspecified severe protein-calorie malnutrition: Secondary | ICD-10-CM | POA: Diagnosis not present

## 2023-08-09 DIAGNOSIS — R498 Other voice and resonance disorders: Secondary | ICD-10-CM | POA: Diagnosis not present

## 2023-08-10 DIAGNOSIS — R498 Other voice and resonance disorders: Secondary | ICD-10-CM | POA: Diagnosis not present

## 2023-08-10 DIAGNOSIS — R278 Other lack of coordination: Secondary | ICD-10-CM | POA: Diagnosis not present

## 2023-08-10 DIAGNOSIS — Z431 Encounter for attention to gastrostomy: Secondary | ICD-10-CM | POA: Diagnosis not present

## 2023-08-10 DIAGNOSIS — R262 Difficulty in walking, not elsewhere classified: Secondary | ICD-10-CM | POA: Diagnosis not present

## 2023-08-10 DIAGNOSIS — C50912 Malignant neoplasm of unspecified site of left female breast: Secondary | ICD-10-CM | POA: Diagnosis not present

## 2023-08-10 DIAGNOSIS — J9601 Acute respiratory failure with hypoxia: Secondary | ICD-10-CM | POA: Diagnosis not present

## 2023-08-10 DIAGNOSIS — J441 Chronic obstructive pulmonary disease with (acute) exacerbation: Secondary | ICD-10-CM | POA: Diagnosis not present

## 2023-08-10 DIAGNOSIS — L8996 Pressure-induced deep tissue damage of unspecified site: Secondary | ICD-10-CM | POA: Diagnosis not present

## 2023-08-10 DIAGNOSIS — Z430 Encounter for attention to tracheostomy: Secondary | ICD-10-CM | POA: Diagnosis not present

## 2023-08-11 DIAGNOSIS — R498 Other voice and resonance disorders: Secondary | ICD-10-CM | POA: Diagnosis not present

## 2023-08-11 DIAGNOSIS — C50912 Malignant neoplasm of unspecified site of left female breast: Secondary | ICD-10-CM | POA: Diagnosis not present

## 2023-08-11 DIAGNOSIS — J9601 Acute respiratory failure with hypoxia: Secondary | ICD-10-CM | POA: Diagnosis not present

## 2023-08-11 DIAGNOSIS — L8996 Pressure-induced deep tissue damage of unspecified site: Secondary | ICD-10-CM | POA: Diagnosis not present

## 2023-08-11 DIAGNOSIS — R262 Difficulty in walking, not elsewhere classified: Secondary | ICD-10-CM | POA: Diagnosis not present

## 2023-08-11 DIAGNOSIS — Z431 Encounter for attention to gastrostomy: Secondary | ICD-10-CM | POA: Diagnosis not present

## 2023-08-11 DIAGNOSIS — R278 Other lack of coordination: Secondary | ICD-10-CM | POA: Diagnosis not present

## 2023-08-11 DIAGNOSIS — J441 Chronic obstructive pulmonary disease with (acute) exacerbation: Secondary | ICD-10-CM | POA: Diagnosis not present

## 2023-08-11 DIAGNOSIS — Z430 Encounter for attention to tracheostomy: Secondary | ICD-10-CM | POA: Diagnosis not present

## 2023-08-16 ENCOUNTER — Other Ambulatory Visit: Payer: Self-pay | Admitting: Internal Medicine

## 2023-08-16 DIAGNOSIS — R5381 Other malaise: Secondary | ICD-10-CM

## 2023-08-16 DIAGNOSIS — C50912 Malignant neoplasm of unspecified site of left female breast: Secondary | ICD-10-CM

## 2023-08-16 DIAGNOSIS — Z431 Encounter for attention to gastrostomy: Secondary | ICD-10-CM | POA: Diagnosis not present

## 2023-08-16 DIAGNOSIS — J441 Chronic obstructive pulmonary disease with (acute) exacerbation: Secondary | ICD-10-CM | POA: Diagnosis not present

## 2023-08-16 DIAGNOSIS — L8996 Pressure-induced deep tissue damage of unspecified site: Secondary | ICD-10-CM | POA: Diagnosis not present

## 2023-08-16 DIAGNOSIS — Z430 Encounter for attention to tracheostomy: Secondary | ICD-10-CM | POA: Diagnosis not present

## 2023-08-16 DIAGNOSIS — R498 Other voice and resonance disorders: Secondary | ICD-10-CM | POA: Diagnosis not present

## 2023-08-16 DIAGNOSIS — E43 Unspecified severe protein-calorie malnutrition: Secondary | ICD-10-CM

## 2023-08-16 DIAGNOSIS — J449 Chronic obstructive pulmonary disease, unspecified: Secondary | ICD-10-CM

## 2023-08-16 DIAGNOSIS — R278 Other lack of coordination: Secondary | ICD-10-CM | POA: Diagnosis not present

## 2023-08-16 DIAGNOSIS — J9601 Acute respiratory failure with hypoxia: Secondary | ICD-10-CM

## 2023-08-16 DIAGNOSIS — R262 Difficulty in walking, not elsewhere classified: Secondary | ICD-10-CM | POA: Diagnosis not present

## 2023-08-23 DIAGNOSIS — E43 Unspecified severe protein-calorie malnutrition: Secondary | ICD-10-CM | POA: Diagnosis not present

## 2023-08-23 DIAGNOSIS — C50912 Malignant neoplasm of unspecified site of left female breast: Secondary | ICD-10-CM | POA: Diagnosis not present

## 2023-08-23 DIAGNOSIS — R5381 Other malaise: Secondary | ICD-10-CM | POA: Diagnosis not present

## 2023-08-23 DIAGNOSIS — J9601 Acute respiratory failure with hypoxia: Secondary | ICD-10-CM | POA: Diagnosis not present

## 2023-08-23 DIAGNOSIS — J449 Chronic obstructive pulmonary disease, unspecified: Secondary | ICD-10-CM | POA: Diagnosis not present

## 2023-08-25 DIAGNOSIS — C787 Secondary malignant neoplasm of liver and intrahepatic bile duct: Secondary | ICD-10-CM | POA: Diagnosis not present

## 2023-08-25 DIAGNOSIS — Z930 Tracheostomy status: Secondary | ICD-10-CM | POA: Diagnosis not present

## 2023-08-25 DIAGNOSIS — J9611 Chronic respiratory failure with hypoxia: Secondary | ICD-10-CM | POA: Diagnosis not present

## 2023-08-25 DIAGNOSIS — J9690 Respiratory failure, unspecified, unspecified whether with hypoxia or hypercapnia: Secondary | ICD-10-CM | POA: Diagnosis not present

## 2023-08-25 DIAGNOSIS — G893 Neoplasm related pain (acute) (chronic): Secondary | ICD-10-CM | POA: Diagnosis not present

## 2023-08-25 DIAGNOSIS — Z9911 Dependence on respirator [ventilator] status: Secondary | ICD-10-CM | POA: Diagnosis not present

## 2023-08-25 DIAGNOSIS — C7800 Secondary malignant neoplasm of unspecified lung: Secondary | ICD-10-CM | POA: Diagnosis not present

## 2023-08-25 DIAGNOSIS — C7981 Secondary malignant neoplasm of breast: Secondary | ICD-10-CM | POA: Diagnosis not present

## 2023-08-25 DIAGNOSIS — Z9981 Dependence on supplemental oxygen: Secondary | ICD-10-CM | POA: Diagnosis not present

## 2023-08-30 DIAGNOSIS — R579 Shock, unspecified: Secondary | ICD-10-CM | POA: Diagnosis not present

## 2023-08-30 DIAGNOSIS — R6521 Severe sepsis with septic shock: Secondary | ICD-10-CM | POA: Diagnosis not present

## 2023-08-30 DIAGNOSIS — C787 Secondary malignant neoplasm of liver and intrahepatic bile duct: Secondary | ICD-10-CM | POA: Diagnosis not present

## 2023-08-30 DIAGNOSIS — C7802 Secondary malignant neoplasm of left lung: Secondary | ICD-10-CM | POA: Diagnosis not present

## 2023-08-30 DIAGNOSIS — I493 Ventricular premature depolarization: Secondary | ICD-10-CM | POA: Diagnosis not present

## 2023-08-30 DIAGNOSIS — C50412 Malignant neoplasm of upper-outer quadrant of left female breast: Secondary | ICD-10-CM | POA: Diagnosis not present

## 2023-08-30 DIAGNOSIS — K551 Chronic vascular disorders of intestine: Secondary | ICD-10-CM | POA: Diagnosis not present

## 2023-08-30 DIAGNOSIS — E875 Hyperkalemia: Secondary | ICD-10-CM | POA: Diagnosis not present

## 2023-08-30 DIAGNOSIS — R000 Tachycardia, unspecified: Secondary | ICD-10-CM | POA: Diagnosis not present

## 2023-08-30 DIAGNOSIS — R457 State of emotional shock and stress, unspecified: Secondary | ICD-10-CM | POA: Diagnosis not present

## 2023-08-30 DIAGNOSIS — T85528A Displacement of other gastrointestinal prosthetic devices, implants and grafts, initial encounter: Secondary | ICD-10-CM | POA: Diagnosis not present

## 2023-08-30 DIAGNOSIS — Z66 Do not resuscitate: Secondary | ICD-10-CM | POA: Diagnosis not present

## 2023-08-30 DIAGNOSIS — Z515 Encounter for palliative care: Secondary | ICD-10-CM | POA: Diagnosis not present

## 2023-08-30 DIAGNOSIS — K559 Vascular disorder of intestine, unspecified: Secondary | ICD-10-CM | POA: Diagnosis not present

## 2023-08-30 DIAGNOSIS — C50312 Malignant neoplasm of lower-inner quadrant of left female breast: Secondary | ICD-10-CM | POA: Diagnosis not present

## 2023-08-30 DIAGNOSIS — E860 Dehydration: Secondary | ICD-10-CM | POA: Diagnosis not present

## 2023-08-30 DIAGNOSIS — A419 Sepsis, unspecified organism: Secondary | ICD-10-CM | POA: Diagnosis not present

## 2023-08-30 DIAGNOSIS — T85698A Other mechanical complication of other specified internal prosthetic devices, implants and grafts, initial encounter: Secondary | ICD-10-CM | POA: Diagnosis not present

## 2023-08-30 DIAGNOSIS — J189 Pneumonia, unspecified organism: Secondary | ICD-10-CM | POA: Diagnosis not present

## 2023-08-30 DIAGNOSIS — J90 Pleural effusion, not elsewhere classified: Secondary | ICD-10-CM | POA: Diagnosis not present

## 2023-08-30 DIAGNOSIS — J690 Pneumonitis due to inhalation of food and vomit: Secondary | ICD-10-CM | POA: Diagnosis not present

## 2023-08-30 DIAGNOSIS — E872 Acidosis, unspecified: Secondary | ICD-10-CM | POA: Diagnosis not present

## 2023-08-30 DIAGNOSIS — C7801 Secondary malignant neoplasm of right lung: Secondary | ICD-10-CM | POA: Diagnosis not present

## 2023-08-31 DIAGNOSIS — E872 Acidosis, unspecified: Secondary | ICD-10-CM | POA: Diagnosis not present

## 2023-08-31 DIAGNOSIS — C7802 Secondary malignant neoplasm of left lung: Secondary | ICD-10-CM | POA: Diagnosis not present

## 2023-08-31 DIAGNOSIS — C787 Secondary malignant neoplasm of liver and intrahepatic bile duct: Secondary | ICD-10-CM | POA: Diagnosis not present

## 2023-08-31 DIAGNOSIS — C50312 Malignant neoplasm of lower-inner quadrant of left female breast: Secondary | ICD-10-CM | POA: Diagnosis not present

## 2023-08-31 DIAGNOSIS — C50412 Malignant neoplasm of upper-outer quadrant of left female breast: Secondary | ICD-10-CM | POA: Diagnosis not present

## 2023-08-31 DIAGNOSIS — C7801 Secondary malignant neoplasm of right lung: Secondary | ICD-10-CM | POA: Diagnosis not present

## 2023-09-01 DIAGNOSIS — E872 Acidosis, unspecified: Secondary | ICD-10-CM | POA: Diagnosis not present

## 2023-09-01 DIAGNOSIS — I493 Ventricular premature depolarization: Secondary | ICD-10-CM | POA: Diagnosis not present

## 2023-09-01 DIAGNOSIS — R000 Tachycardia, unspecified: Secondary | ICD-10-CM | POA: Diagnosis not present

## 2023-09-02 DIAGNOSIS — E872 Acidosis, unspecified: Secondary | ICD-10-CM | POA: Diagnosis not present

## 2023-09-03 DIAGNOSIS — E872 Acidosis, unspecified: Secondary | ICD-10-CM | POA: Diagnosis not present

## 2023-09-04 DIAGNOSIS — J90 Pleural effusion, not elsewhere classified: Secondary | ICD-10-CM | POA: Diagnosis not present

## 2023-09-04 DIAGNOSIS — E872 Acidosis, unspecified: Secondary | ICD-10-CM | POA: Diagnosis not present

## 2023-09-05 DIAGNOSIS — E872 Acidosis, unspecified: Secondary | ICD-10-CM | POA: Diagnosis not present

## 2023-10-02 DEATH — deceased
